# Patient Record
Sex: Male | Born: 1970 | Race: White | Hispanic: No | Marital: Married | Smoking: Never smoker
Health system: Southern US, Community
[De-identification: ages and names within clinical notes are randomized; demographics above are authoritative.]

## PROBLEM LIST (undated history)

## (undated) DIAGNOSIS — K5792 Diverticulitis of intestine, part unspecified, without perforation or abscess without bleeding: Secondary | ICD-10-CM

## (undated) DIAGNOSIS — K429 Umbilical hernia without obstruction or gangrene: Secondary | ICD-10-CM

## (undated) DIAGNOSIS — J45909 Unspecified asthma, uncomplicated: Secondary | ICD-10-CM

## (undated) DIAGNOSIS — I10 Essential (primary) hypertension: Secondary | ICD-10-CM

## (undated) DIAGNOSIS — N400 Enlarged prostate without lower urinary tract symptoms: Secondary | ICD-10-CM

## (undated) DIAGNOSIS — F419 Anxiety disorder, unspecified: Secondary | ICD-10-CM

## (undated) DIAGNOSIS — E785 Hyperlipidemia, unspecified: Secondary | ICD-10-CM

## (undated) DIAGNOSIS — E669 Obesity, unspecified: Secondary | ICD-10-CM

## (undated) DIAGNOSIS — G4730 Sleep apnea, unspecified: Secondary | ICD-10-CM

## (undated) DIAGNOSIS — T7840XA Allergy, unspecified, initial encounter: Secondary | ICD-10-CM

## (undated) DIAGNOSIS — N390 Urinary tract infection, site not specified: Secondary | ICD-10-CM

## (undated) DIAGNOSIS — M1990 Unspecified osteoarthritis, unspecified site: Secondary | ICD-10-CM

## (undated) DIAGNOSIS — K635 Polyp of colon: Secondary | ICD-10-CM

## (undated) DIAGNOSIS — K219 Gastro-esophageal reflux disease without esophagitis: Secondary | ICD-10-CM

## (undated) DIAGNOSIS — E119 Type 2 diabetes mellitus without complications: Secondary | ICD-10-CM

## (undated) DIAGNOSIS — F32A Depression, unspecified: Secondary | ICD-10-CM

## (undated) DIAGNOSIS — M5430 Sciatica, unspecified side: Secondary | ICD-10-CM

## (undated) DIAGNOSIS — K760 Fatty (change of) liver, not elsewhere classified: Secondary | ICD-10-CM

## (undated) DIAGNOSIS — F329 Major depressive disorder, single episode, unspecified: Secondary | ICD-10-CM

## (undated) HISTORY — DX: Type 2 diabetes mellitus without complications: E11.9

## (undated) HISTORY — DX: Umbilical hernia without obstruction or gangrene: K42.9

## (undated) HISTORY — DX: Depression, unspecified: F32.A

## (undated) HISTORY — DX: Hyperlipidemia, unspecified: E78.5

## (undated) HISTORY — DX: Diverticulitis of intestine, part unspecified, without perforation or abscess without bleeding: K57.92

## (undated) HISTORY — DX: Unspecified asthma, uncomplicated: J45.909

## (undated) HISTORY — PX: COLONOSCOPY: SHX174

## (undated) HISTORY — DX: Essential (primary) hypertension: I10

## (undated) HISTORY — DX: Unspecified osteoarthritis, unspecified site: M19.90

## (undated) HISTORY — DX: Gastro-esophageal reflux disease without esophagitis: K21.9

## (undated) HISTORY — PX: OTHER SURGICAL HISTORY: SHX169

## (undated) HISTORY — DX: Obesity, unspecified: E66.9

## (undated) HISTORY — DX: Anxiety disorder, unspecified: F41.9

## (undated) HISTORY — DX: Urinary tract infection, site not specified: N39.0

## (undated) HISTORY — DX: Major depressive disorder, single episode, unspecified: F32.9

## (undated) HISTORY — DX: Polyp of colon: K63.5

## (undated) HISTORY — PX: POLYPECTOMY: SHX149

## (undated) HISTORY — DX: Benign prostatic hyperplasia without lower urinary tract symptoms: N40.0

## (undated) HISTORY — DX: Allergy, unspecified, initial encounter: T78.40XA

## (undated) NOTE — *Deleted (*Deleted)
PROGRESS NOTE  Samuel Lang  DOB: 1971/06/30  PCP: Dois Davenport, MD YNW:[id number]  DOA: 06/23/2020  LOS: 1 day   Chief Complaint  Patient presents with  . Stroke Symptoms   Brief narrative: Patient is a 45 year old male with PMH significant for refractory HTN, HLD, morbid obesity, obstructive sleep apnea on CPAP, gynecomastia on hormone manipulation. Patient presented to the ED on 11/19 with speech disturbance. Last known normal around 11 AM on 11/19.  While at work he noticed sudden onset of inability to comprehend task.   He had significant difficulty with his speech as well as fogginess in his thought. He drove himself to the hospital and his symptoms lasted for around 1 to 2 hours.  Of note, patient reports episodes of falling asleep while driving in the last few months.  In the recent weeks, patient also had multiple episodes of lightheadedness and dizziness.    In the ED, his blood pressure was elevated up to 192/94. He had mild symptoms and was outside window for TPA.  Symptoms lasted for 1 to 2 hours and resolved while in the ED. Labs with potassium low at 3.4, hemoglobin was elevated at 19 CT scan of the brain did not show any acute changes. MRI brain did not show any acute infarction hemorrhage or mass. MRI head and neck did not show any significant stenosis. Patient was admitted to hospitalist service.  Neurology consultation was obtained.  Subjective: Patient was seen and examined this morning. Pleasant middle-aged Caucasian male.  Lying down in bed.  Not in distress. Chart reviewed. Blood pressure elevated to 170/95.  Assessment/Plan: Transient ischemic attack -Presented with sudden onset of confusion and word finding difficulty that lasted about 1 to 2 hours. -Symptoms most likely because of TIA.  Also possibility of hypertensive emergency given significant elevated blood pressure on arrival.   -TIA risk factors include uncontrolled hypertension,  hyperlipidemia, morbid obesity, sleep apnea, use of hormones. -Imagings as above with negative findings -Risk factor modification suggested. -A1c 5.6, LDL 98 -Pending echocardiogram, PT evaluation. -Patient seems to be on Crestor 20 mg daily at home.  Hypertensive urgency History of refractory hypertension -Blood pressure significantly better to 190s on presentation. -Reports compliance to home medicines which include Bystolic 20 mg daily, verapamil 120 mg daily, olmesartan/HCTZ 40 mg / 12.5 mg -Continue same medicines for now. -IV hydralazine as needed.  Prediabetes -A1c 5.6. -On Metformin at home.  I do not think he needs to continue Metformin.  Morbid obesity - Body mass index is 42.58 kg/m. Patient has been advised to make an attempt to improve diet and exercise patterns to aid in weight loss. -Also recommend bariatric evaluation as an outpatient  Obstructive sleep apnea -Encouraged to improve compliance to CPAP.  Gynecomastia -On anastrozole 0.25 mg daily at home. -Hormone supplement tends to increase the risk of stroke.  Ideally would recommend to stop it. -Patient states he used to see endocrinologist and was on testosterone.  It was later switched to anastrozole by urologist.  Patient has not followed up with endocrinologist in a long while.  I recommend him to follow-up with endocrinologist to see if he can come off any hormone replacement therapy.  Polycythemia -Probably secondary to hormone supplement and untreated sleep apnea. -May also increase the risk of TIA by increasing blood viscosity. Recent Labs    06/23/20 1439 06/23/20 1447 06/24/20 0237  HGB 18.2* 19.0* 17.7*   Anxiety/depression -On Wellbutrin   BPH -Continue finasteride  Mobility: Pending PT eval  Code Status:   Code Status: Full Code  Nutritional status: Body mass index is 42.58 kg/m.     Diet Order            Diet heart healthy/carb modified Room service appropriate? Yes; Fluid  consistency: Thin  Diet effective now                 DVT prophylaxis: SCDs   Antimicrobials:  None Fluid: None Consultants: Neurology Family Communication:  Not at bedside  Status is: Inpatient  {Inpatient:23812}  Dispo: The patient is from: {From:23814}              Anticipated d/c is to: {To:23815}              Anticipated d/c date is: {Days:23816}              Patient currently {Medically stable:23817}       Infusions:    Scheduled Meds: . buPROPion  100 mg Oral q AM  . enoxaparin (LOVENOX) injection  70 mg Subcutaneous Q24H  . finasteride  5 mg Oral Daily  . hydrochlorothiazide  12.5 mg Oral Daily  . insulin aspart  0-15 Units Subcutaneous TID WC  . irbesartan  300 mg Oral Daily  . magnesium oxide  400 mg Oral Daily  . metoprolol tartrate  100 mg Oral BID  . pantoprazole  40 mg Oral Daily  . rosuvastatin  20 mg Oral Daily  . verapamil  120 mg Oral q AM    Antimicrobials: Anti-infectives (From admission, onward)   None      PRN meds: acetaminophen **OR** acetaminophen (TYLENOL) oral liquid 160 mg/5 mL **OR** acetaminophen, albuterol, hydrALAZINE, ibuprofen, loratadine, triamcinolone   Objective: Vitals:   06/24/20 0415 06/24/20 0607  BP: (!) 159/75 (!) 170/95  Pulse: (!) 59 64  Resp:    Temp: 97.9 F (36.6 C)   SpO2: 96%    No intake or output data in the 24 hours ending 06/24/20 0749 Filed Weights   06/23/20 1420 06/23/20 2142  Weight: (!) 142.4 kg (!) 142.4 kg   Weight change:  Body mass index is 42.58 kg/m.   Physical Exam: General exam: *** Skin: No rashes, lesions or ulcers. HEENT: Atraumatic, normocephalic, no obvious bleeding Lungs: *** CVS: *** GI/Abd *** CNS: *** Psychiatry: *** Extremities: ***  Data Review: I have personally reviewed the laboratory data and studies available.  Recent Labs  Lab 06/23/20 1439 06/23/20 1447 06/24/20 0237  WBC 9.3  --  9.7  NEUTROABS 6.1  --  5.9  HGB 18.2* 19.0* 17.7*  HCT 54.2*  56.0* 50.8  MCV 91.6  --  90.7  PLT 151  --  153   Recent Labs  Lab 06/23/20 1439 06/23/20 1447  NA 136 138  K 3.5 3.4*  CL 100 98  CO2 25  --   GLUCOSE 219* 209*  BUN 6 7  CREATININE 0.81 0.60*  CALCIUM 9.8  --     F/u labs ***  Signed, Lorin Glass, MD [facility] 06/24/2020

---

## 2004-08-21 ENCOUNTER — Emergency Department (HOSPITAL_COMMUNITY): Admission: EM | Admit: 2004-08-21 | Discharge: 2004-08-22 | Payer: Self-pay | Admitting: Emergency Medicine

## 2004-08-24 ENCOUNTER — Ambulatory Visit: Payer: Self-pay | Admitting: Internal Medicine

## 2004-08-29 ENCOUNTER — Ambulatory Visit: Payer: Self-pay | Admitting: Internal Medicine

## 2004-08-31 ENCOUNTER — Ambulatory Visit: Payer: Self-pay | Admitting: Internal Medicine

## 2004-09-04 ENCOUNTER — Ambulatory Visit: Payer: Self-pay | Admitting: Internal Medicine

## 2004-09-07 ENCOUNTER — Ambulatory Visit: Payer: Self-pay | Admitting: Internal Medicine

## 2004-09-11 ENCOUNTER — Ambulatory Visit: Payer: Self-pay | Admitting: Internal Medicine

## 2004-09-17 ENCOUNTER — Ambulatory Visit: Payer: Self-pay | Admitting: Internal Medicine

## 2004-09-21 ENCOUNTER — Ambulatory Visit: Payer: Self-pay | Admitting: Internal Medicine

## 2004-09-24 ENCOUNTER — Ambulatory Visit: Payer: Self-pay | Admitting: Internal Medicine

## 2004-10-01 ENCOUNTER — Ambulatory Visit: Payer: Self-pay | Admitting: Internal Medicine

## 2004-10-04 ENCOUNTER — Ambulatory Visit: Payer: Self-pay | Admitting: Internal Medicine

## 2004-10-09 ENCOUNTER — Ambulatory Visit: Payer: Self-pay | Admitting: Internal Medicine

## 2004-10-12 ENCOUNTER — Ambulatory Visit: Payer: Self-pay | Admitting: Internal Medicine

## 2004-10-16 ENCOUNTER — Ambulatory Visit: Payer: Self-pay | Admitting: Internal Medicine

## 2004-10-19 ENCOUNTER — Ambulatory Visit: Payer: Self-pay | Admitting: Internal Medicine

## 2004-10-26 ENCOUNTER — Ambulatory Visit: Payer: Self-pay | Admitting: Internal Medicine

## 2004-10-29 ENCOUNTER — Ambulatory Visit: Payer: Self-pay | Admitting: Internal Medicine

## 2004-10-30 ENCOUNTER — Ambulatory Visit: Payer: Self-pay | Admitting: Internal Medicine

## 2004-11-01 ENCOUNTER — Ambulatory Visit: Payer: Self-pay | Admitting: Internal Medicine

## 2004-11-06 ENCOUNTER — Ambulatory Visit: Payer: Self-pay | Admitting: Internal Medicine

## 2004-11-07 ENCOUNTER — Ambulatory Visit: Payer: Self-pay | Admitting: Internal Medicine

## 2004-11-09 ENCOUNTER — Ambulatory Visit: Payer: Self-pay | Admitting: Internal Medicine

## 2004-11-13 ENCOUNTER — Ambulatory Visit: Payer: Self-pay | Admitting: Internal Medicine

## 2004-11-20 ENCOUNTER — Ambulatory Visit: Payer: Self-pay | Admitting: Internal Medicine

## 2004-11-23 ENCOUNTER — Ambulatory Visit: Payer: Self-pay | Admitting: Internal Medicine

## 2004-11-26 ENCOUNTER — Ambulatory Visit: Payer: Self-pay | Admitting: Internal Medicine

## 2004-12-03 ENCOUNTER — Ambulatory Visit: Payer: Self-pay | Admitting: Internal Medicine

## 2004-12-07 ENCOUNTER — Ambulatory Visit: Payer: Self-pay | Admitting: Internal Medicine

## 2004-12-11 ENCOUNTER — Ambulatory Visit: Payer: Self-pay | Admitting: Internal Medicine

## 2004-12-14 ENCOUNTER — Ambulatory Visit: Payer: Self-pay | Admitting: Internal Medicine

## 2004-12-17 ENCOUNTER — Ambulatory Visit: Payer: Self-pay | Admitting: Internal Medicine

## 2004-12-21 ENCOUNTER — Ambulatory Visit: Payer: Self-pay | Admitting: Internal Medicine

## 2004-12-24 ENCOUNTER — Ambulatory Visit: Payer: Self-pay | Admitting: Internal Medicine

## 2004-12-27 ENCOUNTER — Ambulatory Visit: Payer: Self-pay | Admitting: Internal Medicine

## 2005-01-03 ENCOUNTER — Ambulatory Visit: Payer: Self-pay | Admitting: Internal Medicine

## 2005-01-09 ENCOUNTER — Ambulatory Visit: Payer: Self-pay | Admitting: Internal Medicine

## 2005-01-18 ENCOUNTER — Ambulatory Visit: Payer: Self-pay | Admitting: Internal Medicine

## 2005-01-25 ENCOUNTER — Ambulatory Visit: Payer: Self-pay | Admitting: Internal Medicine

## 2005-02-01 ENCOUNTER — Ambulatory Visit: Payer: Self-pay | Admitting: Internal Medicine

## 2005-02-13 ENCOUNTER — Ambulatory Visit: Payer: Self-pay | Admitting: Internal Medicine

## 2005-02-21 ENCOUNTER — Ambulatory Visit: Payer: Self-pay | Admitting: Internal Medicine

## 2005-02-28 ENCOUNTER — Ambulatory Visit: Payer: Self-pay | Admitting: Internal Medicine

## 2005-03-08 ENCOUNTER — Ambulatory Visit: Payer: Self-pay | Admitting: Internal Medicine

## 2005-03-22 ENCOUNTER — Ambulatory Visit: Payer: Self-pay | Admitting: Internal Medicine

## 2005-04-02 ENCOUNTER — Ambulatory Visit: Payer: Self-pay | Admitting: Internal Medicine

## 2005-04-10 ENCOUNTER — Ambulatory Visit: Payer: Self-pay | Admitting: Internal Medicine

## 2005-04-19 ENCOUNTER — Ambulatory Visit: Payer: Self-pay | Admitting: Internal Medicine

## 2005-05-03 ENCOUNTER — Ambulatory Visit: Payer: Self-pay | Admitting: Internal Medicine

## 2005-05-09 ENCOUNTER — Ambulatory Visit: Payer: Self-pay | Admitting: Internal Medicine

## 2005-05-10 ENCOUNTER — Ambulatory Visit: Payer: Self-pay | Admitting: Internal Medicine

## 2005-05-22 ENCOUNTER — Ambulatory Visit: Payer: Self-pay | Admitting: Internal Medicine

## 2005-05-29 ENCOUNTER — Ambulatory Visit: Payer: Self-pay | Admitting: Internal Medicine

## 2005-06-06 ENCOUNTER — Ambulatory Visit: Payer: Self-pay | Admitting: Internal Medicine

## 2005-06-18 ENCOUNTER — Ambulatory Visit: Payer: Self-pay | Admitting: Internal Medicine

## 2005-07-05 ENCOUNTER — Ambulatory Visit: Payer: Self-pay | Admitting: Internal Medicine

## 2005-07-11 ENCOUNTER — Ambulatory Visit: Payer: Self-pay | Admitting: Internal Medicine

## 2005-07-17 ENCOUNTER — Ambulatory Visit: Payer: Self-pay | Admitting: Internal Medicine

## 2005-07-26 ENCOUNTER — Ambulatory Visit: Payer: Self-pay | Admitting: Internal Medicine

## 2005-08-09 ENCOUNTER — Ambulatory Visit: Payer: Self-pay | Admitting: Internal Medicine

## 2005-08-16 ENCOUNTER — Ambulatory Visit: Payer: Self-pay | Admitting: Internal Medicine

## 2005-08-22 ENCOUNTER — Ambulatory Visit: Payer: Self-pay | Admitting: Internal Medicine

## 2005-08-28 ENCOUNTER — Ambulatory Visit: Payer: Self-pay | Admitting: Internal Medicine

## 2005-09-03 ENCOUNTER — Ambulatory Visit: Payer: Self-pay | Admitting: Internal Medicine

## 2005-09-11 ENCOUNTER — Ambulatory Visit: Payer: Self-pay | Admitting: Internal Medicine

## 2005-09-24 ENCOUNTER — Ambulatory Visit: Payer: Self-pay | Admitting: Internal Medicine

## 2005-10-01 ENCOUNTER — Ambulatory Visit: Payer: Self-pay | Admitting: Internal Medicine

## 2005-10-16 ENCOUNTER — Ambulatory Visit: Payer: Self-pay | Admitting: Internal Medicine

## 2005-10-22 ENCOUNTER — Ambulatory Visit: Payer: Self-pay | Admitting: Internal Medicine

## 2005-10-31 ENCOUNTER — Ambulatory Visit: Payer: Self-pay | Admitting: Internal Medicine

## 2005-11-07 ENCOUNTER — Ambulatory Visit: Payer: Self-pay | Admitting: Internal Medicine

## 2005-11-18 ENCOUNTER — Ambulatory Visit: Payer: Self-pay | Admitting: Internal Medicine

## 2005-11-27 ENCOUNTER — Ambulatory Visit: Payer: Self-pay | Admitting: Internal Medicine

## 2005-12-11 ENCOUNTER — Ambulatory Visit: Payer: Self-pay | Admitting: Internal Medicine

## 2005-12-27 ENCOUNTER — Ambulatory Visit: Payer: Self-pay | Admitting: Internal Medicine

## 2006-01-06 ENCOUNTER — Ambulatory Visit: Payer: Self-pay | Admitting: Internal Medicine

## 2006-01-17 ENCOUNTER — Ambulatory Visit: Payer: Self-pay | Admitting: Internal Medicine

## 2006-01-23 ENCOUNTER — Ambulatory Visit: Payer: Self-pay | Admitting: Internal Medicine

## 2006-01-31 ENCOUNTER — Ambulatory Visit: Payer: Self-pay | Admitting: Internal Medicine

## 2006-02-04 ENCOUNTER — Ambulatory Visit: Payer: Self-pay | Admitting: Internal Medicine

## 2006-02-14 ENCOUNTER — Ambulatory Visit: Payer: Self-pay | Admitting: Internal Medicine

## 2006-03-11 ENCOUNTER — Ambulatory Visit: Payer: Self-pay | Admitting: Internal Medicine

## 2006-03-18 ENCOUNTER — Ambulatory Visit: Payer: Self-pay | Admitting: Internal Medicine

## 2006-03-27 ENCOUNTER — Ambulatory Visit: Payer: Self-pay | Admitting: Internal Medicine

## 2006-04-01 ENCOUNTER — Ambulatory Visit: Payer: Self-pay | Admitting: Internal Medicine

## 2006-04-08 ENCOUNTER — Ambulatory Visit: Payer: Self-pay | Admitting: Internal Medicine

## 2006-04-18 ENCOUNTER — Ambulatory Visit: Payer: Self-pay | Admitting: Internal Medicine

## 2006-04-24 ENCOUNTER — Ambulatory Visit: Payer: Self-pay | Admitting: Internal Medicine

## 2006-05-07 ENCOUNTER — Ambulatory Visit: Payer: Self-pay | Admitting: Internal Medicine

## 2006-05-12 ENCOUNTER — Ambulatory Visit: Payer: Self-pay | Admitting: Internal Medicine

## 2006-05-23 ENCOUNTER — Ambulatory Visit: Payer: Self-pay | Admitting: Internal Medicine

## 2006-06-06 ENCOUNTER — Ambulatory Visit: Payer: Self-pay | Admitting: Internal Medicine

## 2006-06-18 ENCOUNTER — Ambulatory Visit: Payer: Self-pay | Admitting: Internal Medicine

## 2006-07-01 ENCOUNTER — Ambulatory Visit: Payer: Self-pay | Admitting: Internal Medicine

## 2006-07-08 ENCOUNTER — Ambulatory Visit: Payer: Self-pay | Admitting: Internal Medicine

## 2006-07-18 ENCOUNTER — Ambulatory Visit: Payer: Self-pay | Admitting: Internal Medicine

## 2006-07-31 ENCOUNTER — Ambulatory Visit: Payer: Self-pay | Admitting: Internal Medicine

## 2006-08-19 ENCOUNTER — Ambulatory Visit: Payer: Self-pay | Admitting: Internal Medicine

## 2006-08-29 ENCOUNTER — Ambulatory Visit: Payer: Self-pay | Admitting: Internal Medicine

## 2006-09-10 ENCOUNTER — Ambulatory Visit: Payer: Self-pay | Admitting: Internal Medicine

## 2006-09-19 ENCOUNTER — Ambulatory Visit: Payer: Self-pay | Admitting: Internal Medicine

## 2006-10-01 ENCOUNTER — Ambulatory Visit: Payer: Self-pay | Admitting: Internal Medicine

## 2006-10-09 ENCOUNTER — Ambulatory Visit: Payer: Self-pay | Admitting: Internal Medicine

## 2006-10-16 ENCOUNTER — Ambulatory Visit: Payer: Self-pay | Admitting: Internal Medicine

## 2006-10-31 ENCOUNTER — Ambulatory Visit: Payer: Self-pay | Admitting: Internal Medicine

## 2006-11-04 ENCOUNTER — Ambulatory Visit: Payer: Self-pay | Admitting: Internal Medicine

## 2006-11-11 ENCOUNTER — Ambulatory Visit: Payer: Self-pay | Admitting: Internal Medicine

## 2006-11-13 ENCOUNTER — Ambulatory Visit: Payer: Self-pay | Admitting: Internal Medicine

## 2006-11-21 ENCOUNTER — Ambulatory Visit: Payer: Self-pay | Admitting: Internal Medicine

## 2006-12-05 ENCOUNTER — Ambulatory Visit: Payer: Self-pay | Admitting: Internal Medicine

## 2006-12-24 ENCOUNTER — Ambulatory Visit: Payer: Self-pay | Admitting: Internal Medicine

## 2007-01-06 ENCOUNTER — Ambulatory Visit: Payer: Self-pay | Admitting: Internal Medicine

## 2007-01-23 ENCOUNTER — Ambulatory Visit: Payer: Self-pay | Admitting: Internal Medicine

## 2007-02-10 ENCOUNTER — Ambulatory Visit: Payer: Self-pay | Admitting: Internal Medicine

## 2007-02-20 ENCOUNTER — Ambulatory Visit: Payer: Self-pay | Admitting: Internal Medicine

## 2007-02-27 ENCOUNTER — Ambulatory Visit: Payer: Self-pay | Admitting: Internal Medicine

## 2007-03-13 ENCOUNTER — Ambulatory Visit: Payer: Self-pay | Admitting: Internal Medicine

## 2007-03-27 ENCOUNTER — Ambulatory Visit: Payer: Self-pay | Admitting: Internal Medicine

## 2007-04-02 ENCOUNTER — Ambulatory Visit: Payer: Self-pay | Admitting: Internal Medicine

## 2007-04-23 ENCOUNTER — Ambulatory Visit: Payer: Self-pay | Admitting: Internal Medicine

## 2007-05-05 ENCOUNTER — Ambulatory Visit: Payer: Self-pay | Admitting: Internal Medicine

## 2007-05-21 ENCOUNTER — Ambulatory Visit: Payer: Self-pay | Admitting: Pulmonary Disease

## 2007-05-21 ENCOUNTER — Ambulatory Visit: Payer: Self-pay | Admitting: Internal Medicine

## 2007-06-03 ENCOUNTER — Ambulatory Visit: Payer: Self-pay | Admitting: Internal Medicine

## 2007-06-19 ENCOUNTER — Ambulatory Visit: Payer: Self-pay | Admitting: Internal Medicine

## 2007-06-26 ENCOUNTER — Ambulatory Visit: Payer: Self-pay | Admitting: Internal Medicine

## 2007-07-01 ENCOUNTER — Ambulatory Visit: Payer: Self-pay | Admitting: Internal Medicine

## 2007-07-10 ENCOUNTER — Ambulatory Visit: Payer: Self-pay | Admitting: Internal Medicine

## 2007-07-17 ENCOUNTER — Ambulatory Visit: Payer: Self-pay | Admitting: Internal Medicine

## 2007-08-04 ENCOUNTER — Ambulatory Visit: Payer: Self-pay | Admitting: Internal Medicine

## 2007-08-14 ENCOUNTER — Ambulatory Visit: Payer: Self-pay | Admitting: Internal Medicine

## 2007-09-03 ENCOUNTER — Ambulatory Visit: Payer: Self-pay | Admitting: Internal Medicine

## 2007-09-11 ENCOUNTER — Ambulatory Visit: Payer: Self-pay | Admitting: Internal Medicine

## 2007-09-17 ENCOUNTER — Ambulatory Visit: Payer: Self-pay | Admitting: Internal Medicine

## 2007-10-02 ENCOUNTER — Ambulatory Visit: Payer: Self-pay | Admitting: Internal Medicine

## 2007-10-23 ENCOUNTER — Ambulatory Visit: Payer: Self-pay | Admitting: Internal Medicine

## 2007-11-03 ENCOUNTER — Ambulatory Visit: Payer: Self-pay | Admitting: Internal Medicine

## 2007-11-20 ENCOUNTER — Ambulatory Visit: Payer: Self-pay | Admitting: Internal Medicine

## 2007-12-11 ENCOUNTER — Ambulatory Visit: Payer: Self-pay | Admitting: Internal Medicine

## 2007-12-25 ENCOUNTER — Ambulatory Visit: Payer: Self-pay | Admitting: Internal Medicine

## 2008-01-01 ENCOUNTER — Ambulatory Visit: Payer: Self-pay | Admitting: Internal Medicine

## 2008-01-22 ENCOUNTER — Ambulatory Visit: Payer: Self-pay | Admitting: Internal Medicine

## 2008-01-29 ENCOUNTER — Ambulatory Visit: Payer: Self-pay | Admitting: Internal Medicine

## 2008-02-12 ENCOUNTER — Ambulatory Visit: Payer: Self-pay | Admitting: Internal Medicine

## 2008-02-26 ENCOUNTER — Ambulatory Visit: Payer: Self-pay | Admitting: Internal Medicine

## 2008-02-29 ENCOUNTER — Ambulatory Visit: Payer: Self-pay | Admitting: Internal Medicine

## 2008-03-11 ENCOUNTER — Ambulatory Visit: Payer: Self-pay | Admitting: Internal Medicine

## 2008-03-25 ENCOUNTER — Ambulatory Visit: Payer: Self-pay | Admitting: Internal Medicine

## 2008-04-01 ENCOUNTER — Ambulatory Visit: Payer: Self-pay | Admitting: Internal Medicine

## 2008-04-01 DIAGNOSIS — J4520 Mild intermittent asthma, uncomplicated: Secondary | ICD-10-CM

## 2008-04-13 ENCOUNTER — Telehealth (INDEPENDENT_AMBULATORY_CARE_PROVIDER_SITE_OTHER): Payer: Self-pay | Admitting: *Deleted

## 2008-04-15 ENCOUNTER — Ambulatory Visit: Payer: Self-pay | Admitting: Internal Medicine

## 2008-04-27 ENCOUNTER — Ambulatory Visit: Payer: Self-pay | Admitting: Internal Medicine

## 2008-05-18 ENCOUNTER — Ambulatory Visit: Payer: Self-pay | Admitting: Internal Medicine

## 2008-06-03 ENCOUNTER — Ambulatory Visit: Payer: Self-pay | Admitting: Internal Medicine

## 2008-06-27 ENCOUNTER — Ambulatory Visit: Payer: Self-pay | Admitting: Internal Medicine

## 2008-07-11 ENCOUNTER — Ambulatory Visit: Payer: Self-pay | Admitting: Internal Medicine

## 2008-08-04 ENCOUNTER — Ambulatory Visit: Payer: Self-pay | Admitting: Internal Medicine

## 2008-08-04 ENCOUNTER — Ambulatory Visit: Payer: Self-pay | Admitting: Pulmonary Disease

## 2008-08-23 ENCOUNTER — Ambulatory Visit: Payer: Self-pay | Admitting: Internal Medicine

## 2008-09-16 ENCOUNTER — Ambulatory Visit: Payer: Self-pay | Admitting: Internal Medicine

## 2008-10-04 ENCOUNTER — Ambulatory Visit: Payer: Self-pay | Admitting: Internal Medicine

## 2008-10-14 ENCOUNTER — Ambulatory Visit: Payer: Self-pay | Admitting: Internal Medicine

## 2008-11-03 ENCOUNTER — Ambulatory Visit: Payer: Self-pay | Admitting: Internal Medicine

## 2008-11-21 ENCOUNTER — Ambulatory Visit: Payer: Self-pay | Admitting: Internal Medicine

## 2008-12-15 ENCOUNTER — Ambulatory Visit: Payer: Self-pay | Admitting: Internal Medicine

## 2009-01-09 ENCOUNTER — Ambulatory Visit: Payer: Self-pay | Admitting: Internal Medicine

## 2009-01-20 ENCOUNTER — Ambulatory Visit: Payer: Self-pay | Admitting: Internal Medicine

## 2009-02-03 ENCOUNTER — Ambulatory Visit: Payer: Self-pay | Admitting: Internal Medicine

## 2009-02-07 ENCOUNTER — Ambulatory Visit: Payer: Self-pay | Admitting: Internal Medicine

## 2009-02-10 ENCOUNTER — Ambulatory Visit: Payer: Self-pay | Admitting: Internal Medicine

## 2009-02-24 ENCOUNTER — Ambulatory Visit: Payer: Self-pay | Admitting: Internal Medicine

## 2009-03-15 ENCOUNTER — Ambulatory Visit: Payer: Self-pay | Admitting: Internal Medicine

## 2009-03-31 ENCOUNTER — Ambulatory Visit: Payer: Self-pay | Admitting: Internal Medicine

## 2009-04-14 ENCOUNTER — Ambulatory Visit: Payer: Self-pay | Admitting: Internal Medicine

## 2009-04-24 ENCOUNTER — Ambulatory Visit: Payer: Self-pay | Admitting: Internal Medicine

## 2009-05-08 ENCOUNTER — Ambulatory Visit: Payer: Self-pay | Admitting: Internal Medicine

## 2009-05-09 ENCOUNTER — Ambulatory Visit: Payer: Self-pay | Admitting: Internal Medicine

## 2009-06-06 ENCOUNTER — Ambulatory Visit: Payer: Self-pay | Admitting: Internal Medicine

## 2009-06-23 ENCOUNTER — Ambulatory Visit: Payer: Self-pay | Admitting: Internal Medicine

## 2009-06-28 ENCOUNTER — Ambulatory Visit: Payer: Self-pay | Admitting: Internal Medicine

## 2009-07-14 ENCOUNTER — Ambulatory Visit: Payer: Self-pay | Admitting: Internal Medicine

## 2009-08-02 ENCOUNTER — Ambulatory Visit: Payer: Self-pay | Admitting: Internal Medicine

## 2009-08-21 ENCOUNTER — Ambulatory Visit: Payer: Self-pay | Admitting: Internal Medicine

## 2009-09-04 ENCOUNTER — Ambulatory Visit: Payer: Self-pay | Admitting: Internal Medicine

## 2009-09-18 ENCOUNTER — Ambulatory Visit: Payer: Self-pay | Admitting: Internal Medicine

## 2009-09-29 ENCOUNTER — Ambulatory Visit: Payer: Self-pay | Admitting: Internal Medicine

## 2009-10-11 ENCOUNTER — Ambulatory Visit: Payer: Self-pay | Admitting: Internal Medicine

## 2009-10-16 ENCOUNTER — Ambulatory Visit: Payer: Self-pay | Admitting: Internal Medicine

## 2009-11-03 ENCOUNTER — Ambulatory Visit: Payer: Self-pay | Admitting: Internal Medicine

## 2009-11-06 ENCOUNTER — Ambulatory Visit: Payer: Self-pay | Admitting: Internal Medicine

## 2009-11-10 ENCOUNTER — Ambulatory Visit: Payer: Self-pay | Admitting: Internal Medicine

## 2009-11-28 ENCOUNTER — Ambulatory Visit: Payer: Self-pay | Admitting: Internal Medicine

## 2009-12-11 ENCOUNTER — Ambulatory Visit: Payer: Self-pay | Admitting: Internal Medicine

## 2009-12-19 ENCOUNTER — Ambulatory Visit: Payer: Self-pay | Admitting: Internal Medicine

## 2009-12-25 ENCOUNTER — Ambulatory Visit: Payer: Self-pay | Admitting: Internal Medicine

## 2010-01-09 ENCOUNTER — Ambulatory Visit: Payer: Self-pay | Admitting: Internal Medicine

## 2010-01-23 ENCOUNTER — Ambulatory Visit: Payer: Self-pay | Admitting: Internal Medicine

## 2010-02-12 ENCOUNTER — Ambulatory Visit: Payer: Self-pay | Admitting: Internal Medicine

## 2010-03-02 ENCOUNTER — Ambulatory Visit: Payer: Self-pay | Admitting: Internal Medicine

## 2010-03-07 ENCOUNTER — Ambulatory Visit: Payer: Self-pay | Admitting: Internal Medicine

## 2010-03-28 ENCOUNTER — Ambulatory Visit: Payer: Self-pay | Admitting: Internal Medicine

## 2010-04-10 ENCOUNTER — Ambulatory Visit: Payer: Self-pay | Admitting: Internal Medicine

## 2010-04-16 ENCOUNTER — Ambulatory Visit: Payer: Self-pay | Admitting: Internal Medicine

## 2010-04-27 ENCOUNTER — Ambulatory Visit: Payer: Self-pay | Admitting: Internal Medicine

## 2010-05-09 ENCOUNTER — Ambulatory Visit: Payer: Self-pay | Admitting: Internal Medicine

## 2010-06-01 ENCOUNTER — Ambulatory Visit: Payer: Self-pay | Admitting: Internal Medicine

## 2010-06-15 ENCOUNTER — Ambulatory Visit: Payer: Self-pay | Admitting: Internal Medicine

## 2010-06-18 ENCOUNTER — Ambulatory Visit: Payer: Self-pay | Admitting: Internal Medicine

## 2010-06-27 ENCOUNTER — Ambulatory Visit: Payer: Self-pay | Admitting: Internal Medicine

## 2010-07-03 ENCOUNTER — Ambulatory Visit: Payer: Self-pay | Admitting: Internal Medicine

## 2010-07-23 ENCOUNTER — Ambulatory Visit: Payer: Self-pay | Admitting: Internal Medicine

## 2010-08-24 ENCOUNTER — Ambulatory Visit: Payer: Self-pay | Admitting: Internal Medicine

## 2010-09-04 NOTE — Miscellaneous (Signed)
Summary: Injection Record / [facility]    Injection Record / [facility]    Imported By: Lennie Odor 04/06/2010 10:37:38  _____________________________________________________________________  External Attachment:    Type:   Image     Comment:   External Document

## 2010-09-04 NOTE — Miscellaneous (Signed)
Summary: Injection Record/[facility]  Injection Record/[facility]   Imported By: Lanelle Bal 12/06/2009 14:16:33  _____________________________________________________________________  External Attachment:    Type:   Image     Comment:   External Document

## 2010-09-04 NOTE — Assessment & Plan Note (Signed)
Summary: 12 months/apc   Primary Fredrik Mogel/Referring Kahron Kauth:  Samuel Lang  CC:  Yearly follow up visit-allergies..  History of Present Illness: History of Present Illness: 04/01/08- 40 year old man returns for follow-up of allergic rhinitis.  Increased itching of the eyes, sneezing, and nasal congestion since July, not sleeping as well.  Using Claritin or Zyrtec.  Stopped nasal Cortisone.  Continues allergy vaccine here at 1:10. allergy vaccine safety was reviewed.  Jun 01, 2009- Allergic rhinitis, minimal asthma Doing very well as he continues vaccine without problems. Had flu vax yesterday. Asked about asthma, he admits a few times when he has wheezed a little,  but not a problem. We discussed risk/benefit of Nasonex use- recently restarted for the season. Based on shape of palate I asked about snoring. He had a sleep study which didn't show apnea, but has gained weight since then. Loud snoring. Discussed Breath right stirps, mouth pieces, chin straps.   06-01-10- Allergic rhinitis, minimal asthma Yearly follow up visit-allergies. Had a bronchitis this summer and saw his primary office which took care of it., Otherwise the allergy shots have worked really well- he calls them  "life changing". Uses either claritin or zyrtec if needed. Occasional ear stuffiness. Nasonex works very well when needed- used a few times each week. A litle cough and wheeze noted today- sometimes first thing in the morning but not routine. He doesn't feel need to do anything- coffee is sufficient in the mornings as a mild bronchodilator.     Preventive Screening-Counseling & Management  Alcohol-Tobacco     Smoking Status: never  Current Medications (verified): 1)  Crestor 10 Mg Tabs (Rosuvastatin Calcium) .... Take 1 By Mouth Once Daily 2)  Zoloft 100 Mg Tabs (Sertraline Hcl) .... Take 1 By Mouth Once Daily 3)  Claritin 10 Mg Tabs (Loratadine) .... Take 1 By Mouth Once Daily As Needed  Allergies 4)  Allergy Vaccine 1:10 Gh 5)  Nasonex 50 Mcg/act Susp (Mometasone Furoate) .Marland Kitchen.. 1-2 Sprays Each Nostril Once Daily 6)  Zyrtec Hives Relief 10 Mg Tabs (Cetirizine Hcl) .... Take 1 By Mouth Once Daily As Needed Allergies 7)  Multivitamins  Tabs (Multiple Vitamin) .... Take 1 By Mouth Once Daily 8)  Fish Oil 1000 Mg Caps (Omega-3 Fatty Acids) .... Take 3 By Mouth Once Daily 9)  Magnesium 200 Mg Tabs (Magnesium) .... Take 1 By Mouth Once Daily  Allergies (verified): No Known Drug Allergies  Past History:  Past Medical History: Last updated: 04/01/2008  ALLERGIC RHINITIS (ICD-477.9)  Past Surgical History: Last updated: 04/01/2008 Traumatic pneumothorax sledding accident, rib fx/ left pneumothorax, chest tube  Family History: Last updated: 01-Jun-2009 Mother- died lung cancer, smoked Father- died DM, CVA  Social History: Last updated: Jun 01, 2009 Patient never smoked.  [employer] for [employer] Married  Risk Factors: Smoking Status: never (06/01/10)  Review of Systems      See HPI       The patient complains of non-productive cough and nasal congestion/difficulty breathing through nose.  The patient denies shortness of breath with activity, shortness of breath at rest, productive cough, chest pain, irregular heartbeats, acid heartburn, indigestion, loss of appetite, weight change, abdominal pain, difficulty swallowing, sore throat, tooth/dental problems, headaches, sneezing, itching, ear ache, rash, change in color of mucus, and fever.    Vital Signs:  Patient profile:   40 year old male Height:      72 inches Weight:      246 pounds  BMI:     33.48 O2 Sat:      95 % on Room air Pulse rate:   68 / minute BP sitting:   110 / 72  (left arm) Cuff size:   large  Vitals Entered By: Reynaldo Minium CMA (May 09, 2010 9:07 AM)  O2 Flow:  Room air CC: Yearly follow up visit-allergies.   Physical Exam  Additional Exam:  General:  A/Ox3; pleasant and cooperative, NAD, obese SKIN: no rash, lesions NODES: no lymphadenopathy HEENT: [place]/AT, EOM- WNL, Conjuctivae- clear, PERRLA, TM-cerumen, nonobstructing cerumen, Nose- clear, Throat- clear and wnl,  Mallampati III NECK: Supple w/ fair ROM, JVD- none, normal carotid impulses w/o bruits Thyroid- normal to palpation CHEST: Clear to P&A HEART: RRR, no m/g/r heard ABDOMEN: Soft and nl;  AVW:UJWJ, nl pulses, no edema  NEURO: Grossly intact to observation      Impression & Recommendations:  Problem # 1:  ALLERGIC RHINITIS (ICD-477.9)  Excellent control and hjis allergy vaccine has been a big help; Minor occasional wheeze and eustachian dysfunction are noted from history. These might require attention in the future, but he is doing well overall. i discussed use of Sudafed if needed.  His updated medication list for this problem includes:    Claritin 10 Mg Tabs (Loratadine) .Marland Kitchen... Take 1 by mouth once daily as needed allergies    Nasonex 50 Mcg/act Susp (Mometasone furoate) .Marland Kitchen... 1-2 sprays each nostril once daily    Zyrtec Hives Relief 10 Mg Tabs (Cetirizine hcl) .Marland Kitchen... Take 1 by mouth once daily as needed allergies  Medications Added to Medication List This Visit: 1)  Zoloft 100 Mg Tabs (Sertraline hcl) .... Take 1 by mouth once daily 2)  Zyrtec Hives Relief 10 Mg Tabs (Cetirizine hcl) .... Take 1 by mouth once daily as needed allergies 3)  Multivitamins Tabs (Multiple vitamin) .... Take 1 by mouth once daily 4)  Fish Oil 1000 Mg Caps (Omega-3 fatty acids) .... Take 3 by mouth once daily 5)  Magnesium 200 Mg Tabs (Magnesium) .... Take 1 by mouth once daily 6)  Ambien 10 Mg Tabs (Zolpidem tartrate) .Marland Kitchen.. 1 for sleep as needed  Other Orders: Est. Patient Level III (19147) Admin 1st Vaccine (82956) Flu Vaccine 81yrs + (21308)  Patient Instructions: 1)  Please schedule a follow-up appointment in 1 year. 2)  Continue allergy vaccine 3)  Call as needed for script refills  etc. 4)  Flu vax    Flu Vaccine Consent Questions     Do you have a history of severe allergic reactions to this vaccine? no    Any prior history of allergic reactions to egg and/or gelatin? no    Do you have a sensitivity to the preservative Thimersol? no    Do you have a past history of Guillan-Barre Syndrome? no    Do you currently have an acute febrile illness? no    Have you ever had a severe reaction to latex? no    Vaccine information given and explained to patient? yes    Are you currently pregnant? no    Lot Number:[id number]   Exp Date:02/02/2011   Site Given  Left Deltoid IMbflu Vernie Murders  May 09, 2010 9:35 AM

## 2010-09-12 ENCOUNTER — Encounter: Payer: Self-pay | Admitting: Internal Medicine

## 2010-09-12 DIAGNOSIS — J301 Allergic rhinitis due to pollen: Secondary | ICD-10-CM

## 2010-10-10 ENCOUNTER — Encounter: Payer: Self-pay | Admitting: Internal Medicine

## 2010-10-10 ENCOUNTER — Ambulatory Visit (INDEPENDENT_AMBULATORY_CARE_PROVIDER_SITE_OTHER): Payer: BC Managed Care – PPO

## 2010-10-10 DIAGNOSIS — J301 Allergic rhinitis due to pollen: Secondary | ICD-10-CM | POA: Insufficient documentation

## 2010-10-16 NOTE — Assessment & Plan Note (Signed)
Summary: ALLERGY/CB   Nurse Visit   Allergies: No Known Drug Allergies  Orders Added: 1)  Allergy Injection (1) [95115] 

## 2010-10-23 ENCOUNTER — Ambulatory Visit (INDEPENDENT_AMBULATORY_CARE_PROVIDER_SITE_OTHER): Payer: BC Managed Care – PPO

## 2010-10-23 DIAGNOSIS — J301 Allergic rhinitis due to pollen: Secondary | ICD-10-CM

## 2010-10-23 NOTE — Miscellaneous (Signed)
Summary: Injection [employer] risk [employer]   Imported By: Sherian Rein 10/15/2010 14:43:23  _____________________________________________________________________  External Attachment:    Type:   Image     Comment:   External Document

## 2010-11-12 ENCOUNTER — Ambulatory Visit (INDEPENDENT_AMBULATORY_CARE_PROVIDER_SITE_OTHER): Payer: BC Managed Care – PPO

## 2010-11-12 DIAGNOSIS — J301 Allergic rhinitis due to pollen: Secondary | ICD-10-CM

## 2010-12-03 ENCOUNTER — Ambulatory Visit (INDEPENDENT_AMBULATORY_CARE_PROVIDER_SITE_OTHER): Payer: BC Managed Care – PPO

## 2010-12-03 DIAGNOSIS — J309 Allergic rhinitis, unspecified: Secondary | ICD-10-CM

## 2010-12-17 ENCOUNTER — Ambulatory Visit (INDEPENDENT_AMBULATORY_CARE_PROVIDER_SITE_OTHER): Payer: BC Managed Care – PPO

## 2010-12-17 DIAGNOSIS — J309 Allergic rhinitis, unspecified: Secondary | ICD-10-CM

## 2010-12-18 NOTE — Assessment & Plan Note (Signed)
[facility]                             PULMONARY OFFICE NOTE   Samuel Lang, Samuel Lang                        MRN:          [id number]  DATE:04/02/2007                            DOB:          [birth year]    PULMONARY ALLERGY FOLLOW-UP:   PROBLEMS:  1. Allergic rhinitis.  2. Eustachian dysfunction.  3. Mild intermittent asthma.  4. Restless legs.   HISTORY:  He says he has the best allergic status in my life.  He  believes allergy vaccine has helped him substantially.  Occasionally one  eye is read; otherwise, he recognizes no particular symptoms.  Minor  postnasal drainage today.  He continues vaccine now at 1:10 with no side  effect or reaction problems and is trying to avoid obvious triggers.  He  stopped Mirapex for restless legs because it overstimulated and he is  managing without anything now.   OBJECTIVE:  Weight 250 pounds.  BP 122/78.  Eyes, nose, throat and chest were all clear.  Heart sounds were normal.  No obvious restlessness or tremor.   IMPRESSION:  1. Allergic rhinitis.  2. Asthma.  3. Mild restless legs syndrome.   PLAN:  1. Continue vaccine.  2. Schedule return 1 year, earlier p.r.n.  3. Sleep hygiene discussed.     Clinton D. Maple Hudson, MD, Tonny Bollman, FACP  Electronically Signed    CDY/MedQ  DD: 04/06/2007  DT: 04/06/2007  Job #: [id number]   cc:   Lovenia Kim, D.O.

## 2010-12-28 ENCOUNTER — Ambulatory Visit (INDEPENDENT_AMBULATORY_CARE_PROVIDER_SITE_OTHER): Payer: BC Managed Care – PPO

## 2010-12-28 DIAGNOSIS — J309 Allergic rhinitis, unspecified: Secondary | ICD-10-CM

## 2011-01-25 ENCOUNTER — Ambulatory Visit (INDEPENDENT_AMBULATORY_CARE_PROVIDER_SITE_OTHER): Payer: BC Managed Care – PPO

## 2011-01-25 DIAGNOSIS — J309 Allergic rhinitis, unspecified: Secondary | ICD-10-CM

## 2011-02-22 ENCOUNTER — Ambulatory Visit (INDEPENDENT_AMBULATORY_CARE_PROVIDER_SITE_OTHER): Payer: BC Managed Care – PPO

## 2011-02-22 DIAGNOSIS — J309 Allergic rhinitis, unspecified: Secondary | ICD-10-CM

## 2011-03-25 ENCOUNTER — Ambulatory Visit (INDEPENDENT_AMBULATORY_CARE_PROVIDER_SITE_OTHER): Payer: BC Managed Care – PPO

## 2011-03-25 DIAGNOSIS — J309 Allergic rhinitis, unspecified: Secondary | ICD-10-CM

## 2011-04-29 ENCOUNTER — Ambulatory Visit (INDEPENDENT_AMBULATORY_CARE_PROVIDER_SITE_OTHER): Payer: BC Managed Care – PPO

## 2011-04-29 DIAGNOSIS — J309 Allergic rhinitis, unspecified: Secondary | ICD-10-CM

## 2011-05-08 ENCOUNTER — Ambulatory Visit: Payer: Self-pay | Admitting: Internal Medicine

## 2012-03-31 ENCOUNTER — Telehealth: Payer: Self-pay | Admitting: *Deleted

## 2012-03-31 NOTE — Telephone Encounter (Signed)
I have called pt.no response. Samuel Lang had an appt.05/08/11;he nos.His last shot was 04/29/11. I assume he stopped his shots. He would go a few wks.,sometimes a month,he's never just stopped coming.Please advise.

## 2012-03-31 NOTE — Telephone Encounter (Signed)
D/C his allergy vaccine based on non-compliance

## 2012-04-01 NOTE — Telephone Encounter (Signed)
Done

## 2012-12-09 ENCOUNTER — Encounter (HOSPITAL_COMMUNITY): Payer: Self-pay | Admitting: *Deleted

## 2012-12-09 ENCOUNTER — Emergency Department (HOSPITAL_COMMUNITY)
Admission: EM | Admit: 2012-12-09 | Discharge: 2012-12-09 | Disposition: A | Discharge status: Home / Self Care | Payer: BC Managed Care – PPO | Attending: Emergency Medicine | Admitting: Emergency Medicine

## 2012-12-09 DIAGNOSIS — K5732 Diverticulitis of large intestine without perforation or abscess without bleeding: Secondary | ICD-10-CM | POA: Insufficient documentation

## 2012-12-09 DIAGNOSIS — Z8709 Personal history of other diseases of the respiratory system: Secondary | ICD-10-CM | POA: Insufficient documentation

## 2012-12-09 DIAGNOSIS — R197 Diarrhea, unspecified: Secondary | ICD-10-CM | POA: Insufficient documentation

## 2012-12-09 DIAGNOSIS — Z79899 Other long term (current) drug therapy: Secondary | ICD-10-CM | POA: Insufficient documentation

## 2012-12-09 DIAGNOSIS — R509 Fever, unspecified: Secondary | ICD-10-CM | POA: Insufficient documentation

## 2012-12-09 DIAGNOSIS — Z9889 Other specified postprocedural states: Secondary | ICD-10-CM | POA: Insufficient documentation

## 2012-12-09 DIAGNOSIS — K5792 Diverticulitis of intestine, part unspecified, without perforation or abscess without bleeding: Secondary | ICD-10-CM

## 2012-12-09 DIAGNOSIS — K5900 Constipation, unspecified: Secondary | ICD-10-CM | POA: Insufficient documentation

## 2012-12-09 DIAGNOSIS — Z7982 Long term (current) use of aspirin: Secondary | ICD-10-CM | POA: Insufficient documentation

## 2012-12-09 LAB — COMPREHENSIVE METABOLIC PANEL
ALT: 58 U/L — ABNORMAL HIGH (ref 0–53)
AST: 22 U/L (ref 0–37)
Albumin: 3.9 g/dL (ref 3.5–5.2)
Alkaline Phosphatase: 82 U/L (ref 39–117)
BUN: 10 mg/dL (ref 6–23)
CO2: 26 mEq/L (ref 19–32)
Calcium: 9.7 mg/dL (ref 8.4–10.5)
Chloride: 100 mEq/L (ref 96–112)
Creatinine, Ser: 0.87 mg/dL (ref 0.50–1.35)
GFR calc Af Amer: >90 mL/min (ref >90)
GFR calc non Af Amer: >90 mL/min (ref >90)
Glucose, Bld: 108 mg/dL — ABNORMAL HIGH (ref 70–99)
Potassium: 4 mEq/L (ref 3.5–5.1)
Sodium: 135 mEq/L (ref 135–145)
Total Bilirubin: 0.6 mg/dL (ref 0.3–1.2)
Total Protein: 7.8 g/dL (ref 6.0–8.3)

## 2012-12-09 LAB — URINE MICROSCOPIC-ADD ON

## 2012-12-09 LAB — URINALYSIS, ROUTINE W REFLEX MICROSCOPIC
Bilirubin Urine: NEGATIVE
Glucose, UA: NEGATIVE mg/dL
Hgb urine dipstick: NEGATIVE
Ketones, ur: NEGATIVE mg/dL
Nitrite: NEGATIVE
Protein, ur: NEGATIVE mg/dL
Specific Gravity, Urine: 1.018 (ref 1.005–1.030)
Urobilinogen, UA: 1 mg/dL (ref 0.0–1.0)
pH: 7 (ref 5.0–8.0)

## 2012-12-09 LAB — CBC WITH DIFFERENTIAL/PLATELET
Basophils Absolute: 0.1 10*3/uL (ref 0.0–0.1)
Basophils Relative: 1 % (ref 0–1)
Eosinophils Absolute: 0.9 10*3/uL — ABNORMAL HIGH (ref 0.0–0.7)
Eosinophils Relative: 7 % — ABNORMAL HIGH (ref 0–5)
HCT: 45 % (ref 39.0–52.0)
Hemoglobin: 15.9 g/dL (ref 13.0–17.0)
Lymphocytes Relative: 18 % (ref 12–46)
Lymphs Abs: 2.2 10*3/uL (ref 0.7–4.0)
MCH: 29.7 pg (ref 26.0–34.0)
MCHC: 35.3 g/dL (ref 30.0–36.0)
MCV: 84.1 fL (ref 78.0–100.0)
Monocytes Absolute: 1.1 10*3/uL — ABNORMAL HIGH (ref 0.1–1.0)
Monocytes Relative: 9 % (ref 3–12)
Neutro Abs: 7.9 10*3/uL — ABNORMAL HIGH (ref 1.7–7.7)
Neutrophils Relative %: 65 % (ref 43–77)
Platelets: 229 10*3/uL (ref 150–400)
RBC: 5.35 MIL/uL (ref 4.22–5.81)
RDW: 12.4 % (ref 11.5–15.5)
WBC: 12.2 10*3/uL — ABNORMAL HIGH (ref 4.0–10.5)

## 2012-12-09 LAB — LIPASE, BLOOD: Lipase: 29 U/L (ref 11–59)

## 2012-12-09 MED ORDER — CIPROFLOXACIN HCL 500 MG PO TABS: 500.0000 mg | ORAL_TABLET | Freq: Two times a day (BID) | ORAL | Status: DC

## 2012-12-09 MED ORDER — MORPHINE SULFATE 4 MG/ML IJ SOLN
2.0000 mg | Freq: Once | INTRAMUSCULAR | Status: AC
  Administered 2012-12-09: 2 mg via INTRAVENOUS
  Filled 2012-12-09: qty 1

## 2012-12-09 MED ORDER — LORATADINE 10 MG PO TABS
10.0000 mg | ORAL_TABLET | Freq: Every day | ORAL | Status: DC
  Administered 2012-12-09: 10 mg via ORAL
  Filled 2012-12-09: qty 1

## 2012-12-09 MED ORDER — METRONIDAZOLE 500 MG PO TABS: 500.0000 mg | ORAL_TABLET | Freq: Two times a day (BID) | ORAL | Status: DC

## 2012-12-09 MED ORDER — SODIUM CHLORIDE 0.9 % IV SOLN
Freq: Once | INTRAVENOUS | Status: AC
  Administered 2012-12-09: 20:00:00 via INTRAVENOUS

## 2012-12-09 MED ORDER — SODIUM CHLORIDE 0.9 % IV BOLUS (SEPSIS)
500.0000 mL | Freq: Once | INTRAVENOUS | Status: AC
  Administered 2012-12-09: 500 mL via INTRAVENOUS

## 2012-12-09 MED ORDER — METRONIDAZOLE IN NACL 5-0.79 MG/ML-% IV SOLN
500.0000 mg | Freq: Once | INTRAVENOUS | Status: AC
  Administered 2012-12-09: 500 mg via INTRAVENOUS
  Filled 2012-12-09: qty 100

## 2012-12-09 MED ORDER — CIPROFLOXACIN IN D5W 400 MG/200ML IV SOLN
400.0000 mg | Freq: Once | INTRAVENOUS | Status: AC
  Administered 2012-12-09: 400 mg via INTRAVENOUS
  Filled 2012-12-09: qty 200

## 2012-12-09 MED ORDER — HYDROCODONE-ACETAMINOPHEN 5-325 MG PO TABS: 1.0000 | ORAL_TABLET | ORAL | Status: DC | PRN

## 2012-12-09 NOTE — ED Notes (Signed)
MD at bedside. Dr. Kohut at bedside speaking with pt. 

## 2012-12-09 NOTE — ED Notes (Signed)
Bed:WA10<BR> Expected date:<BR> Expected time:<BR> Means of arrival:<BR> Comments:<BR>

## 2012-12-09 NOTE — ED Notes (Signed)
Pt c/o left lower abd pain x's 1 week. Reports hx of diverticulitis. Has been having flares off and on for past couple of years reports pain has increased recently.

## 2012-12-09 NOTE — ED Notes (Signed)
Pt states his allergies are bothering him. Pt c/o stuffy nose. Asking for Claritin. PA aware. Further orders received.

## 2012-12-09 NOTE — ED Provider Notes (Signed)
History     CSN: [id number]  Arrival date & time 12/09/12  1648   First MD Initiated Contact with Patient 12/09/12 1819      Chief Complaint  Patient presents with  . Abdominal Pain    (Consider location/radiation/quality/duration/timing/severity/associated sxs/prior treatment) HPI  This is a 42 year old male past medical history significant for diverticulosis presenting to the ED with left lower quadrant pain 4/10 that began one week ago w/ waxing and waning cramping pain. States he typically has a small flare up with his diverticulosis once a year, but it is managed as outpatient w/ bowel rest, states this was not working this time, but pain and other symptoms similar to previous episodes. Had a colonoscopy done two years ago confirming diverticulosis w/o spread from his original diagnosis 10 years ago. Associated low grade fever, on and off constipation and diarrhea w/o blood. Denies SOB, CP, urinary symptoms.   Past Medical History  Diagnosis Date  . Allergic rhinitis     Past Surgical History  Procedure Laterality Date  . Traumatic pneumothorax sledding accident,rib rx, left pneumothorax, chest tube      History reviewed. No pertinent family history.  History  Substance Use Topics  . Smoking status: Never Smoker   . Smokeless tobacco: Not on file  . Alcohol Use: Yes     Comment: occ      Review of Systems  Constitutional: Positive for fever and chills.  Eyes: Negative.   Respiratory: Negative.   Cardiovascular: Negative.   Gastrointestinal: Positive for abdominal pain, diarrhea and constipation. Negative for nausea, vomiting, blood in stool, abdominal distention, anal bleeding and rectal pain.  Genitourinary: Negative for dysuria.  Musculoskeletal: Negative.   Skin: Negative.   Neurological: Negative for headaches.    Allergies  Review of patient's allergies indicates no known allergies.  Home Medications   Current Outpatient Rx  Name  Route  Sig   Dispense  Refill  . aspirin 325 MG tablet   Oral   Take 325 mg by mouth every 4 (four) hours as needed for pain.         Marland Kitchen loratadine-pseudoephedrine (CLARITIN-D 12-HOUR) 5-120 MG per tablet   Oral   Take 1 tablet by mouth 2 (two) times daily.         . Multiple Vitamins-Minerals (MULTIVITAMIN & MINERAL PO)   Oral   Take 1 tablet by mouth daily.           . sertraline (ZOLOFT) 100 MG tablet   Oral   Take 100 mg by mouth daily.           . ciprofloxacin (CIPRO) 500 MG tablet   Oral   Take 1 tablet (500 mg total) by mouth 2 (two) times daily.   28 tablet   0   . HYDROcodone-acetaminophen (NORCO/VICODIN) 5-325 MG per tablet   Oral   Take 1-2 tablets by mouth every 4 (four) hours as needed for pain.   12 tablet   0   . metroNIDAZOLE (FLAGYL) 500 MG tablet   Oral   Take 1 tablet (500 mg total) by mouth 2 (two) times daily.   14 tablet   0     BP 141/80  Pulse 91  Temp(Src) 98.9 F (37.2 C) (Oral)  Resp 16  SpO2 96%  Physical Exam  Constitutional: He is oriented to person, place, and time. He appears well-developed and well-nourished.  HENT:  Head: Normocephalic and atraumatic.  Eyes: EOM are normal. Pupils are equal, round,  and reactive to light.  Cardiovascular: Normal rate, regular rhythm and normal heart sounds.   Pulmonary/Chest: Effort normal and breath sounds normal.  Abdominal: Soft. Bowel sounds are normal. There is tenderness in the left lower quadrant. There is no rigidity, no rebound, no guarding and no CVA tenderness.    Neurological: He is alert and oriented to person, place, and time.  Skin: Skin is warm and dry.  Psychiatric: He has a normal mood and affect.    ED Course  Procedures (including critical care time)  Medications  loratadine (CLARITIN) tablet 10 mg (10 mg Oral Given 12/09/12 2112)  sodium chloride 0.9 % bolus 500 mL (0 mLs Intravenous Stopped 12/09/12 2209)  0.9 %  sodium chloride infusion ( Intravenous Stopped 12/09/12 2255)    morphine 4 MG/ML injection 2 mg (2 mg Intravenous Given 12/09/12 1930)  metroNIDAZOLE (FLAGYL) IVPB 500 mg (0 mg Intravenous Stopped 12/09/12 2209)  ciprofloxacin (CIPRO) IVPB 400 mg (0 mg Intravenous Stopped 12/09/12 2255)   Abdominal pain improved w/ treatment. Exam remains soft, mildly tender in LLQ, non-distended no peritoneal signs.  Labs Reviewed  CBC WITH DIFFERENTIAL - Abnormal; Notable for the following:    WBC 12.2 (*)    Neutro Abs 7.9 (*)    Monocytes Absolute 1.1 (*)    Eosinophils Relative 7 (*)    Eosinophils Absolute 0.9 (*)    All other components within normal limits  COMPREHENSIVE METABOLIC PANEL - Abnormal; Notable for the following:    Glucose, Bld 108 (*)    ALT 58 (*)    All other components within normal limits  URINALYSIS, ROUTINE W REFLEX MICROSCOPIC - Abnormal; Notable for the following:    Leukocytes, UA TRACE (*)    All other components within normal limits  LIPASE, BLOOD  URINE MICROSCOPIC-ADD ON   No results found.   1. Diverticulitis       MDM  Patient is nontoxic, nonseptic appearing, in no apparent distress.  Patient's pain and other symptoms adequately managed in emergency department.  Fluid bolus given.  Labs and vitals reviewed.  Patient does not meet the SIRS or Sepsis criteria.  On repeat exam patient does not have a surgical abdomin and there are nor peritoneal signs. Pain consistent w/ previous diverticulitis flare ups.  Patient discharged home with outpatient treatment including antibiotics, pain management, and bowel rest and given strict instructions for follow-up with their primary care physician.  I have also discussed reasons to return immediately to the ER.  Patient expresses understanding and agrees with plan. Patient d/w with Dr. Juleen China, agrees with plan. Patient is stable at time of discharge.             Lise Auer Quinlin Conant, PA-C 12/10/12 0040

## 2012-12-15 NOTE — ED Provider Notes (Signed)
Medical screening examination/treatment/procedure(s) were conducted as a shared visit with non-physician practitioner(s) and myself.  I personally evaluated the patient during the encounter.  Pt with LLQ pain. Suspect diverticulitis. Discussed presumptive tx vs CT. Pt opting not to CT at this time which I think is reasonable. He does have some L sided abdominal tenderness on exam, but not peritoneal. Continues cipro/flagyl. PRN pain meds. Stirct return precautions discussed.   Raeford Razor, MD 12/15/12 [phone]

## 2013-04-22 ENCOUNTER — Other Ambulatory Visit: Payer: Self-pay | Admitting: Endocrinology

## 2013-04-22 DIAGNOSIS — E236 Other disorders of pituitary gland: Secondary | ICD-10-CM

## 2013-04-29 ENCOUNTER — Ambulatory Visit
Admission: RE | Admit: 2013-04-29 | Discharge: 2013-04-29 | Disposition: A | Discharge status: Home / Self Care | Payer: BC Managed Care – PPO | Source: Ambulatory Visit | Attending: Endocrinology | Admitting: Endocrinology

## 2013-04-29 DIAGNOSIS — E236 Other disorders of pituitary gland: Secondary | ICD-10-CM

## 2013-04-29 MED ORDER — GADOBENATE DIMEGLUMINE 529 MG/ML IV SOLN
10.0000 mL | Freq: Once | INTRAVENOUS | Status: AC | PRN
  Administered 2013-04-29: 10 mL via INTRAVENOUS

## 2013-08-26 ENCOUNTER — Encounter: Payer: Self-pay | Admitting: Internal Medicine

## 2013-10-25 ENCOUNTER — Ambulatory Visit (INDEPENDENT_AMBULATORY_CARE_PROVIDER_SITE_OTHER): Payer: BC Managed Care – PPO | Admitting: Internal Medicine

## 2013-10-25 ENCOUNTER — Encounter: Payer: Self-pay | Admitting: Internal Medicine

## 2013-10-25 VITALS — BP 122/80 | HR 83 | Resp 14 | Ht 73.0 in | Wt 291.8 lb

## 2013-10-25 DIAGNOSIS — J4520 Mild intermittent asthma, uncomplicated: Secondary | ICD-10-CM

## 2013-10-25 DIAGNOSIS — J45909 Unspecified asthma, uncomplicated: Secondary | ICD-10-CM

## 2013-10-25 DIAGNOSIS — J301 Allergic rhinitis due to pollen: Secondary | ICD-10-CM

## 2013-10-25 NOTE — Patient Instructions (Signed)
Consider otc antihistamine- Allegra/ fexofenadine, Zyrtec/ cetirizine, Claritin/ loratadine  Daily or as needed  You can also try an otc decongestant Sudafed-PE, by itself, or with an antihistamine  You can also use a nasal steroid spray like Flonase/ fluticasone or Nasacort/ triamcinolone.  These may work best used daily during allergy season- 1 or 2 puffs each nostril at bedtime.

## 2013-10-25 NOTE — Progress Notes (Signed)
10/25/13- 49 yoM never smoker -Former patient-had moved and now back again. Has not been on allergy injections since he moved away. Was on allergy vaccine for allergic rhinitis. Last visit here 04/02/2007. Was on allergy vaccine 1:10 at bedtime and doing well, for diagnoses of allergic rhinitis, eustachian dysfunction and mild intermittent asthma. Allergy vaccine was stopped while he was living in [place]. Now complains of itching ears, nasal congestion. Spring worse than fall. Taking Claritin-D and Flonase. He was also followed here in the past for restless leg syndrome and tried Mirapex which he did not like. A sleep study was negative for sleep apnea at that time. He has been wearing an oral appliance for snoring, from a dentist "snoreGuard". 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  Prior to Admission medications   Medication Sig Start Date End Date Taking? Authorizing Provider  ANDROGEL PUMP 20.25 MG/ACT (1.62%) GEL 3 pumps daily 10/06/13  Yes Historical Provider, MD  cholecalciferol (VITAMIN D) 1000 UNITS tablet Take 1,000 Units by mouth daily.   Yes Historical Provider, MD  CRESTOR 5 MG tablet Take 1 tablet by mouth daily. 10/06/13  Yes Historical Provider, MD  loratadine-pseudoephedrine (CLARITIN-D 12-HOUR) 5-120 MG per tablet Take 1 tablet by mouth 2 (two) times daily.   Yes Historical Provider, MD  magnesium gluconate (MAGONATE) 500 MG tablet Take 500 mg by mouth daily.   Yes Historical Provider, MD  Multiple Vitamins-Minerals (MULTIVITAMIN & MINERAL PO) Take 1 tablet by mouth daily.     Yes Historical Provider, MD  Omega-3 Fatty Acids (FISH OIL) 1000 MG CAPS Take 1 capsule by mouth daily.   Yes Historical Provider, MD  sertraline (ZOLOFT) 100 MG tablet Take 100 mg by mouth daily.     Yes Historical Provider, MD  zolpidem (AMBIEN CR) 12.5 MG CR tablet Take 1 tablet by mouth at bedtime as needed. 10/14/13  Yes  Historical Provider, MD   Past Medical History  Diagnosis Date  . Allergic rhinitis   . Hyperlipidemia   . Depression    Past Surgical History  Procedure Laterality Date  . Traumatic pneumothorax sledding accident,rib rx, left pneumothorax, chest tube     Family History  Problem Relation Age of Onset  . Lung cancer Mother   . Allergies Brother   . Stroke Father   . Diabetes Father    History   Social History  . Marital Status: Married    Spouse Name: N/A    Number of Children: 2  . Years of Education: N/A   Occupational History  . College Admin.   [employer]   Social History Main Topics  . Smoking status: Never Smoker   . Smokeless tobacco: Not on file  . Alcohol Use: Yes     Comment: occ  . Drug Use: No  . Sexual Activity: Not on file   Other Topics Concern  . Not on file   Social History Narrative  . No narrative on file   ROS-see HPI Constitutional:   No-   weight loss, night sweats, fevers, chills, fatigue, lassitude. HEENT:   No-  headaches, difficulty swallowing, tooth/dental problems, sore throat,       +sneezing, itching, ear ache, +nasal congestion, post nasal drip,  CV:  No-   chest pain, orthopnea, PND, swelling in lower extremities, anasarca,  dizziness, palpitations Resp: No-   shortness of breath with exertion or at rest.              No-   productive cough,  No non-productive cough,  No- coughing up of blood.              No-   change in color of mucus.  No- wheezing.   Skin: No-   rash or lesions. GI:  + heartburn, indigestion, no-abdominal pain, nausea, vomiting, diarrhea,                 change in bowel habits, loss of appetite GU: No-   dysuria, change in color of urine, no urgency or frequency.  No- flank pain. MS:  No-   joint pain or swelling.  No- decreased range of motion.  No- back pain. Neuro-     nothing unusual Psych:  No- change in mood or affect. +depression or anxiety.  No memory loss.  OBJ-  Physical Exam General- Alert, Oriented, Affect-appropriate, Distress- none acute, overweight Skin- rash-none, lesions- none, excoriation- none Lymphadenopathy- none Head- atraumatic            Eyes- Gross vision intact, PERRLA, conjunctivae and secretions clear            Ears- nonobstructive cerumen            Nose- Clear, no-Septal dev, mucus, polyps, erosion, perforation             Throat- Mallampati III-IV , mucosa clear , drainage- none, tonsils- atrophic Neck- flexible , trachea midline, no stridor , thyroid nl, carotid no bruit Chest - symmetrical excursion , unlabored           Heart/CV- RRR , no murmur , no gallop  , no rub, nl s1 s2                           - JVD- none , edema- none, stasis changes- none, varices- none           Lung- clear to P&A, wheeze- none, cough- none , dullness-none, rub- none           Chest wall-  Abd- tender-no, distended-no, bowel sounds-present, HSM- no Br/ Gen/ Rectal- Not done, not indicated Extrem- cyanosis- none, clubbing, none, atrophy- none, strength- nl Neuro- grossly intact to observation

## 2013-11-14 ENCOUNTER — Encounter: Payer: Self-pay | Admitting: Internal Medicine

## 2013-11-14 NOTE — Assessment & Plan Note (Signed)
Clear now. We will watch this problem

## 2013-11-14 NOTE — Assessment & Plan Note (Signed)
Mild seasonal and perennial allergic rhinitis Plan-try Allegra and Sudafed- PE as we watch onset of spring pollen season.

## 2013-12-28 ENCOUNTER — Encounter (INDEPENDENT_AMBULATORY_CARE_PROVIDER_SITE_OTHER): Payer: Self-pay

## 2013-12-28 ENCOUNTER — Encounter: Payer: Self-pay | Admitting: Internal Medicine

## 2013-12-28 ENCOUNTER — Ambulatory Visit (INDEPENDENT_AMBULATORY_CARE_PROVIDER_SITE_OTHER): Payer: BC Managed Care – PPO | Admitting: Internal Medicine

## 2013-12-28 VITALS — BP 132/74 | HR 66 | Ht 73.0 in | Wt 289.6 lb

## 2013-12-28 DIAGNOSIS — J4520 Mild intermittent asthma, uncomplicated: Secondary | ICD-10-CM

## 2013-12-28 DIAGNOSIS — L255 Unspecified contact dermatitis due to plants, except food: Secondary | ICD-10-CM

## 2013-12-28 DIAGNOSIS — L237 Allergic contact dermatitis due to plants, except food: Secondary | ICD-10-CM

## 2013-12-28 DIAGNOSIS — J45909 Unspecified asthma, uncomplicated: Secondary | ICD-10-CM

## 2013-12-28 DIAGNOSIS — J301 Allergic rhinitis due to pollen: Secondary | ICD-10-CM

## 2013-12-28 MED ORDER — METHYLPREDNISOLONE ACETATE 80 MG/ML IJ SUSP
80.0000 mg | Freq: Once | INTRAMUSCULAR | Status: AC
Start: 2013-12-28 — End: 2013-12-28
  Administered 2013-12-28: 80 mg via INTRAMUSCULAR

## 2013-12-28 NOTE — Patient Instructions (Signed)
Depo 80   For dx acute poison ivy dermatitis  We will schedule allergy skin tests. Please remember that antihistamines directly  Block the skin tests, so it will be important to stay off all antihistamines for 3 days before the skin tests. If you are unsure, please call and ask for Joellen Jersey, my nurse.

## 2013-12-28 NOTE — Progress Notes (Signed)
10/25/13- 89 yoM never smoker -Former patient-had moved and now back again. Has not been on allergy injections since he moved away. Was on allergy vaccine for allergic rhinitis. Last visit here 04/02/2007. Was on allergy vaccine 1:10 at bedtime and doing well, for diagnoses of allergic rhinitis, eustachian dysfunction and mild intermittent asthma. Allergy vaccine was stopped while he was living in [place]. Now complains of itching ears, nasal congestion. Spring worse than fall. Taking Claritin-D and Flonase. He was also followed here in the past for restless leg syndrome and tried Mirapex which he did not like. A sleep study was negative for sleep apnea at that time. He has been wearing an oral appliance for snoring, from a dentist "snoreGuard". 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  [id number] yoM never smoker followed for allergic rhinitis, asthma FOLLOWS FOR: Having out break of posion oak/ivy. Would like something for this Sudafed PE and Allegra have been adequate but not ideal for rhinitis. Nasal symptoms worse in the fall than in the spring with some perennial congestion and drainage. He would like to go back on allergy shots. Incidental acute problem-poison ivy on ankles  ROS-see HPI Constitutional:   No-   weight loss, night sweats, fevers, chills, fatigue, lassitude. HEENT:   No-  headaches, difficulty swallowing, tooth/dental problems, sore throat,       +sneezing, +itching, ear ache, +nasal congestion, post nasal drip,  CV:  No-   chest pain, orthopnea, PND, swelling in lower extremities, anasarca,                                  dizziness, palpitations Resp: No-   shortness of breath with exertion or at rest.              No-   productive cough,  No non-productive cough,  No- coughing up of blood.              No-   change in color of mucus.  No- wheezing.   Skin: +itching rash on ankles GI:  +  heartburn, indigestion, no-abdominal pain, nausea, vomiting GU: No-   dysuria, change in color of urine, no urgency or frequency.  No- flank pain. MS:  No-   joint pain or swelling.   Neuro-     nothing unusual Psych:  No- change in mood or affect. +depression or anxiety.  No memory loss.  OBJ- Physical Exam General- Alert, Oriented, Affect-appropriate, Distress- none acute, overweight Skin- + excoriated rash on ankles consistent with poison ivy contact dermatitis Lymphadenopathy- none Head- atraumatic            Eyes- Gross vision intact, PERRLA, conjunctivae and secretions clear            Ears- nonobstructive cerumen            Nose- Clear, no-Septal dev, mucus, polyps, erosion, perforation             Throat- Mallampati III-IV , mucosa clear , drainage- none, tonsils- atrophic Neck- flexible , trachea midline, no stridor , thyroid nl, carotid no bruit Chest - symmetrical excursion , unlabored           Heart/CV- RRR , no murmur , no gallop  , no rub, nl s1 s2                           -  JVD- none , edema- none, stasis changes- none, varices- none           Lung- clear to P&A, wheeze- none, cough- none , dullness-none, rub- none           Chest wall-  Abd-  Br/ Gen/ Rectal- Not done, not indicated Extrem- cyanosis- none, clubbing, none, atrophy- none, strength- nl Neuro- grossly intact to observation

## 2014-01-05 ENCOUNTER — Ambulatory Visit (INDEPENDENT_AMBULATORY_CARE_PROVIDER_SITE_OTHER): Payer: BC Managed Care – PPO | Admitting: Internal Medicine

## 2014-01-05 ENCOUNTER — Encounter: Payer: Self-pay | Admitting: Internal Medicine

## 2014-01-05 VITALS — BP 136/84 | HR 106 | Ht 73.0 in | Wt 287.4 lb

## 2014-01-05 DIAGNOSIS — J4520 Mild intermittent asthma, uncomplicated: Secondary | ICD-10-CM

## 2014-01-05 DIAGNOSIS — E291 Testicular hypofunction: Secondary | ICD-10-CM

## 2014-01-05 DIAGNOSIS — J45909 Unspecified asthma, uncomplicated: Secondary | ICD-10-CM

## 2014-01-05 DIAGNOSIS — J301 Allergic rhinitis due to pollen: Secondary | ICD-10-CM

## 2014-01-05 DIAGNOSIS — R7989 Other specified abnormal findings of blood chemistry: Secondary | ICD-10-CM

## 2014-01-05 NOTE — Patient Instructions (Addendum)
Order- [facility] - pt would like referral to establish primary care     Dx asthma, low-T  We will be able to start allergy vaccine as discussed. The allergy lab will call you when they are ready to start.  Please call as needed  It will be ok to resume antihistamines as needed

## 2014-01-05 NOTE — Assessment & Plan Note (Signed)
He knows he wants allergy shots. Educated again risk incl anaphylaxis/ death, alterntives, realistic goals. Plan start allergy vaccine

## 2014-01-05 NOTE — Assessment & Plan Note (Signed)
Clear on exam

## 2014-01-05 NOTE — Progress Notes (Signed)
10/25/13- 33 yoM never smoker -Former patient-had moved and now back again. Has not been on allergy injections since he moved away. Was on allergy vaccine for allergic rhinitis. Last visit here 04/02/2007. Was on allergy vaccine 1:10 at that time and doing well, for diagnoses of allergic rhinitis, eustachian dysfunction and mild intermittent asthma. Allergy vaccine was stopped while he was living in [place]. Now complains of itching ears, nasal congestion. Spring worse than fall. Taking Claritin-D and Flonase. He was also followed here in the past for restless leg syndrome and tried Mirapex which he did not like. A sleep study was negative for sleep apnea at that time. He has been wearing an oral appliance for snoring, from a dentist "snoreGuard". 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  [birth year] yoM never smoker followed for allergic rhinitis, asthma FOLLOWS FOR: Having out break of posion oak/ivy. Would like something for this  [birth year]42 yoM never smoker followed for allergic rhinitis, asthma No antihistamines, ORC cough syrups, or OTC sleep aids in past 3 days For allergy skin testing 01/05/14- broadly positive incl grass, tree, weed, dust, animals Wants vaccine. Educated risk/benefit, alternatives. Questions answered.  Now ears itch. Steroid inj last visit cleared poison ivy.   ROS-see HPI Constitutional:   No-   weight loss, night sweats, fevers, chills, fatigue, lassitude. HEENT:   No-  headaches, difficulty swallowing, tooth/dental problems, sore throat,       +sneezing, +itching, ear ache, +nasal congestion, post nasal drip,  CV:  No-   chest pain, orthopnea, PND, swelling in lower extremities, anasarca,                                  dizziness, palpitations Resp: No-   shortness of breath with exertion or at rest.              No-   productive cough,  No non-productive cough,  No- coughing up of blood.               No-   change in color of mucus.  No- wheezing.   Skin: No-   rash or lesions. GI:  + heartburn, indigestion, no-abdominal pain, nausea, vomiting,  GU:  MS:  No-   joint pain or swelling.   Neuro-     nothing unusual Psych:  No- change in mood or affect. +depression or anxiety.  No memory loss.  OBJ- Physical Exam General- Alert, Oriented, Affect-appropriate, Distress- none acute, overweight Skin- rash-none, lesions- none, excoriation- none Lymphadenopathy- none Head- atraumatic            Eyes- Gross vision intact, PERRLA, conjunctivae and secretions clear            Ears- nonobstructive cerumen            Nose- Clear, no-Septal dev, mucus, polyps, erosion, perforation             Throat- Mallampati III-IV , mucosa clear , drainage- none, tonsils- atrophic Neck- flexible , trachea midline, no stridor , thyroid nl, carotid no bruit Chest - symmetrical excursion , unlabored           Heart/CV- RRR , no murmur , no gallop  , no rub, nl s1 s2                           -  JVD- none , edema- none, stasis changes- none, varices- none           Lung- clear to P&A, wheeze- none, cough- none , dullness-none, rub- none           Chest wall-  Abd-  Br/ Gen/ Rectal- Not done, not indicated Extrem- cyanosis- none, clubbing, none, atrophy- none, strength- nl Neuro- grossly intact to observation

## 2014-01-11 ENCOUNTER — Ambulatory Visit (INDEPENDENT_AMBULATORY_CARE_PROVIDER_SITE_OTHER): Payer: BC Managed Care – PPO

## 2014-01-11 DIAGNOSIS — J309 Allergic rhinitis, unspecified: Secondary | ICD-10-CM

## 2014-02-07 ENCOUNTER — Encounter: Payer: Self-pay | Admitting: Internal Medicine

## 2014-02-08 ENCOUNTER — Telehealth: Payer: Self-pay | Admitting: Internal Medicine

## 2014-02-08 ENCOUNTER — Ambulatory Visit (INDEPENDENT_AMBULATORY_CARE_PROVIDER_SITE_OTHER): Payer: BC Managed Care – PPO

## 2014-02-08 DIAGNOSIS — J309 Allergic rhinitis, unspecified: Secondary | ICD-10-CM

## 2014-02-08 NOTE — Telephone Encounter (Signed)
Opened in error

## 2014-02-11 ENCOUNTER — Ambulatory Visit: Payer: BC Managed Care – PPO

## 2014-02-13 DIAGNOSIS — L237 Allergic contact dermatitis due to plants, except food: Secondary | ICD-10-CM | POA: Insufficient documentation

## 2014-02-13 NOTE — Assessment & Plan Note (Signed)
Persistent symptoms. We have discussed allergy skin testing

## 2014-02-13 NOTE — Assessment & Plan Note (Signed)
Typical rash Plan-Depo-Medrol, discussed topical management

## 2014-02-13 NOTE — Assessment & Plan Note (Signed)
Adequately controlled 

## 2014-02-15 ENCOUNTER — Ambulatory Visit (INDEPENDENT_AMBULATORY_CARE_PROVIDER_SITE_OTHER): Payer: BC Managed Care – PPO

## 2014-02-15 DIAGNOSIS — J309 Allergic rhinitis, unspecified: Secondary | ICD-10-CM

## 2014-02-18 ENCOUNTER — Ambulatory Visit: Payer: BC Managed Care – PPO

## 2014-02-22 ENCOUNTER — Ambulatory Visit (INDEPENDENT_AMBULATORY_CARE_PROVIDER_SITE_OTHER): Payer: BC Managed Care – PPO

## 2014-02-22 DIAGNOSIS — J309 Allergic rhinitis, unspecified: Secondary | ICD-10-CM

## 2014-02-25 ENCOUNTER — Ambulatory Visit (INDEPENDENT_AMBULATORY_CARE_PROVIDER_SITE_OTHER): Payer: BC Managed Care – PPO

## 2014-02-25 DIAGNOSIS — J309 Allergic rhinitis, unspecified: Secondary | ICD-10-CM

## 2014-02-28 ENCOUNTER — Ambulatory Visit: Payer: BC Managed Care – PPO

## 2014-03-02 ENCOUNTER — Ambulatory Visit (INDEPENDENT_AMBULATORY_CARE_PROVIDER_SITE_OTHER): Payer: BC Managed Care – PPO

## 2014-03-02 DIAGNOSIS — J309 Allergic rhinitis, unspecified: Secondary | ICD-10-CM

## 2014-03-09 ENCOUNTER — Ambulatory Visit: Payer: BC Managed Care – PPO

## 2014-03-10 ENCOUNTER — Ambulatory Visit (INDEPENDENT_AMBULATORY_CARE_PROVIDER_SITE_OTHER): Payer: BC Managed Care – PPO

## 2014-03-10 DIAGNOSIS — J309 Allergic rhinitis, unspecified: Secondary | ICD-10-CM

## 2014-03-14 ENCOUNTER — Ambulatory Visit (INDEPENDENT_AMBULATORY_CARE_PROVIDER_SITE_OTHER): Payer: BC Managed Care – PPO

## 2014-03-14 DIAGNOSIS — J309 Allergic rhinitis, unspecified: Secondary | ICD-10-CM

## 2014-03-17 ENCOUNTER — Ambulatory Visit: Payer: BC Managed Care – PPO | Admitting: Internal Medicine

## 2014-03-17 ENCOUNTER — Ambulatory Visit (INDEPENDENT_AMBULATORY_CARE_PROVIDER_SITE_OTHER): Payer: BC Managed Care – PPO

## 2014-03-17 DIAGNOSIS — J309 Allergic rhinitis, unspecified: Secondary | ICD-10-CM

## 2014-03-21 ENCOUNTER — Ambulatory Visit: Payer: BC Managed Care – PPO

## 2014-03-22 ENCOUNTER — Ambulatory Visit (INDEPENDENT_AMBULATORY_CARE_PROVIDER_SITE_OTHER): Payer: BC Managed Care – PPO

## 2014-03-22 DIAGNOSIS — J309 Allergic rhinitis, unspecified: Secondary | ICD-10-CM

## 2014-10-17 ENCOUNTER — Encounter: Payer: Self-pay | Admitting: Internal Medicine

## 2014-11-16 ENCOUNTER — Telehealth: Payer: Self-pay | Admitting: Internal Medicine

## 2014-11-16 NOTE — Telephone Encounter (Signed)
Yes

## 2014-11-16 NOTE — Telephone Encounter (Signed)
Samuel Lang hasn't been in since 03/17/14. His vac. Exp. In Feb. So I of course had to discard. Do I consider him d/c'd?

## 2014-11-17 NOTE — Telephone Encounter (Signed)
Noted  

## 2015-01-27 ENCOUNTER — Encounter: Payer: Self-pay | Admitting: Internal Medicine

## 2015-03-13 ENCOUNTER — Ambulatory Visit: Payer: BC Managed Care – PPO | Admitting: Internal Medicine

## 2015-03-30 ENCOUNTER — Encounter: Payer: Self-pay | Admitting: Internal Medicine

## 2015-04-06 ENCOUNTER — Other Ambulatory Visit (INDEPENDENT_AMBULATORY_CARE_PROVIDER_SITE_OTHER): Payer: BC Managed Care – PPO

## 2015-04-06 ENCOUNTER — Ambulatory Visit (INDEPENDENT_AMBULATORY_CARE_PROVIDER_SITE_OTHER): Payer: BC Managed Care – PPO | Admitting: Internal Medicine

## 2015-04-06 ENCOUNTER — Encounter: Payer: Self-pay | Admitting: Internal Medicine

## 2015-04-06 VITALS — BP 140/100 | HR 76 | Temp 98.0°F | Resp 16 | Ht 72.0 in | Wt 305.0 lb

## 2015-04-06 DIAGNOSIS — Z0000 Encounter for general adult medical examination without abnormal findings: Secondary | ICD-10-CM

## 2015-04-06 DIAGNOSIS — M1990 Unspecified osteoarthritis, unspecified site: Secondary | ICD-10-CM | POA: Diagnosis not present

## 2015-04-06 DIAGNOSIS — R7989 Other specified abnormal findings of blood chemistry: Secondary | ICD-10-CM | POA: Diagnosis not present

## 2015-04-06 LAB — COMPREHENSIVE METABOLIC PANEL
ALT: 61 U/L — ABNORMAL HIGH (ref 0–53)
AST: 33 U/L (ref 0–37)
Albumin: 4.5 g/dL (ref 3.5–5.2)
Alkaline Phosphatase: 56 U/L (ref 39–117)
BUN: 13 mg/dL (ref 6–23)
CHLORIDE: 102 meq/L (ref 96–112)
CO2: 24 meq/L (ref 19–32)
CREATININE: 0.85 mg/dL (ref 0.40–1.50)
Calcium: 10.1 mg/dL (ref 8.4–10.5)
GFR: 103.57 mL/min (ref >60.00)
Glucose, Bld: 105 mg/dL — ABNORMAL HIGH (ref 70–99)
POTASSIUM: 4.2 meq/L (ref 3.5–5.1)
SODIUM: 136 meq/L (ref 135–145)
Total Bilirubin: 0.7 mg/dL (ref 0.2–1.2)
Total Protein: 7.8 g/dL (ref 6.0–8.3)

## 2015-04-06 LAB — LIPID PANEL
CHOL/HDL RATIO: 7
Cholesterol: 251 mg/dL — ABNORMAL HIGH (ref 0–200)
HDL: 37.3 mg/dL — ABNORMAL LOW (ref >39.00)
NONHDL: 213.51
Triglycerides: 269 mg/dL — ABNORMAL HIGH (ref 0.0–149.0)
VLDL: 53.8 mg/dL — AB (ref 0.0–40.0)

## 2015-04-06 LAB — CBC
HCT: 49.6 % (ref 39.0–52.0)
Hemoglobin: 17 g/dL (ref 13.0–17.0)
MCHC: 34.3 g/dL (ref 30.0–36.0)
MCV: 86.6 fl (ref 78.0–100.0)
PLATELETS: 194 10*3/uL (ref 150.0–400.0)
RBC: 5.73 Mil/uL (ref 4.22–5.81)
RDW: 13.2 % (ref 11.5–15.5)
WBC: 9.2 10*3/uL (ref 4.0–10.5)

## 2015-04-06 LAB — LDL CHOLESTEROL, DIRECT: LDL DIRECT: 182 mg/dL

## 2015-04-06 LAB — TSH: TSH: 3.59 u[IU]/mL (ref 0.35–4.50)

## 2015-04-06 LAB — VITAMIN B12: VITAMIN B 12: 603 pg/mL (ref 211–911)

## 2015-04-06 LAB — HEMOGLOBIN A1C: HEMOGLOBIN A1C: 5.3 % (ref 4.6–6.5)

## 2015-04-06 MED ORDER — MELOXICAM 15 MG PO TABS: 15.0000 mg | ORAL_TABLET | Freq: Every day | ORAL | Status: DC

## 2015-04-06 NOTE — Progress Notes (Signed)
Pre visit review using our clinic review tool, if applicable. No additional management support is needed unless otherwise documented below in the visit note. 

## 2015-04-06 NOTE — Progress Notes (Signed)
   Subjective:    Patient ID: Samuel Lang, male    DOB: January 04, 1971, 44 y.o.   MRN: [id number]  HPI The patient is a new 44 YO man coming in for several problems. He is concerned about his weight and has not been able to lose weight. He has gained about 45 pounds in the last 5 years. Does not exercise much. Diet is fair but does not know what it should be. Has never talked to a nutritionist. Has a dental appliance for his snoring and has been checked for sleep apnea in the past which was negative. Next problem is arthritis in a lot of his joints. Has worsened in the last 5 years and he is not sure if the weight has to do with it. Taking a [place] herb which has helped immensely and also does chiropractor to keep his back doing well. Takes ibuprofen rarely. Pain 4/10 when present. Denies >20 minutes stiffness in the morning. No family history of inflammatory arthritis and has seen rheumatology in the past and been tested for those.   [facility], social history reviewed and updated.   Review of Systems  Constitutional: Positive for unexpected weight change. Negative for fever, activity change, appetite change and fatigue.  HENT: Negative.   Eyes: Negative.   Respiratory: Negative for cough, chest tightness, shortness of breath and wheezing.   Cardiovascular: Negative for chest pain, palpitations and leg swelling.  Gastrointestinal: Negative for nausea, abdominal pain, diarrhea, constipation, blood in stool and abdominal distention.  Musculoskeletal: Positive for back pain and arthralgias. Negative for myalgias and gait problem.  Skin: Negative.   Neurological: Negative.   Psychiatric/Behavioral: Negative.       Objective:   Physical Exam  Constitutional: He is oriented to person, place, and time. He appears well-developed and well-nourished.  Overweight  HENT:  Head: Normocephalic and atraumatic.  Eyes: EOM are normal.  Neck: Normal range of motion.  Cardiovascular: Normal rate and regular  rhythm.   Pulmonary/Chest: Effort normal and breath sounds normal. No respiratory distress. He has no wheezes. He has no rales.  Abdominal: Soft. Bowel sounds are normal. He exhibits no distension. There is no tenderness. There is no rebound.  Musculoskeletal: He exhibits no edema.  Neurological: He is alert and oriented to person, place, and time. Coordination normal.  Skin: Skin is warm and dry.  Psychiatric: He has a normal mood and affect.   Filed Vitals:   04/06/15 0819 04/06/15 0913  BP: 144/92 140/100  Pulse: 76   Temp: 98 F (36.7 C)   TempSrc: Oral   Resp: 16   Height: 6' (1.829 m)   Weight: 305 lb (138.347 kg)   SpO2: 97%       Assessment & Plan:

## 2015-04-06 NOTE — Assessment & Plan Note (Signed)
Referral placed for nutrition and talked with him about the needed exercise for weight loss. Checking for complications with labs. BP moderately elevated today and he wants to try diet and lifestyle changes for 3-6 months. BP checks at home are 130s/80s generally.

## 2015-04-06 NOTE — Assessment & Plan Note (Signed)
Rx for meloxicam. Can continue using chinese herbs if desired. Talked to him about weight loss as being a therapy as well. He will work on it.

## 2015-04-06 NOTE — Patient Instructions (Addendum)
We have sent in a medicine for the arthritis called meloxicam which is a once daily medicine. You can take it as needed for the pain and stiffness in the joints.   You can safely use melatonin for sleep when needed.   We have put in the referral for the nutritionist and you will get a call about scheduling. Your insurance will cover this visit and they can help you come up with a plan that fits into your life. When you are trying to lose weight we recommend 45-60 minutes of exercise per day for 5-6 days per week.   Come back in about 6 months and we will check in on the weight (and likely the cholesterol).   Health Maintenance A healthy lifestyle and preventative care can promote health and wellness.  Maintain regular health, dental, and eye exams.  Eat a healthy diet. Foods like vegetables, fruits, whole grains, low-fat dairy products, and lean protein foods contain the nutrients you need and are low in calories. Decrease your intake of foods high in solid fats, added sugars, and salt. Get information about a proper diet from your health care provider, if necessary.  Regular physical exercise is one of the most important things you can do for your health. Most adults should get at least 150 minutes of moderate-intensity exercise (any activity that increases your heart rate and causes you to sweat) each week. In addition, most adults need muscle-strengthening exercises on 2 or more days a week.   Maintain a healthy weight. The body mass index (BMI) is a screening tool to identify possible weight problems. It provides an estimate of body fat based on height and weight. Your health care provider can find your BMI and can help you achieve or maintain a healthy weight. For males 20 years and older:  A BMI below 18.5 is considered underweight.  A BMI of 18.5 to 24.9 is normal.  A BMI of 25 to 29.9 is considered overweight.  A BMI of 30 and above is considered obese.  Maintain normal blood  lipids and cholesterol by exercising and minimizing your intake of saturated fat. Eat a balanced diet with plenty of fruits and vegetables. Blood tests for lipids and cholesterol should begin at age 31 and be repeated every 5 years. If your lipid or cholesterol levels are high, you are over age 26, or you are at high risk for heart disease, you may need your cholesterol levels checked more frequently.Ongoing high lipid and cholesterol levels should be treated with medicines if diet and exercise are not working.  If you smoke, find out from your health care provider how to quit. If you do not use tobacco, do not start.  Lung cancer screening is recommended for adults aged 5-80 years who are at high risk for developing lung cancer because of a history of smoking. A yearly low-dose CT scan of the lungs is recommended for people who have at least a 30-pack-year history of smoking and are current smokers or have quit within the past 15 years. A pack year of smoking is smoking an average of 1 pack of cigarettes a day for 1 year (for example, a 30-pack-year history of smoking could mean smoking 1 pack a day for 30 years or 2 packs a day for 15 years). Yearly screening should continue until the smoker has stopped smoking for at least 15 years. Yearly screening should be stopped for people who develop a health problem that would prevent them from having lung  cancer treatment.  If you choose to drink alcohol, do not have more than 2 drinks per day. One drink is considered to be 12 oz (360 mL) of beer, 5 oz (150 mL) of wine, or 1.5 oz (45 mL) of liquor.  Avoid the use of street drugs. Do not share needles with anyone. Ask for help if you need support or instructions about stopping the use of drugs.  High blood pressure causes heart disease and increases the risk of stroke. Blood pressure should be checked at least every 1-2 years. Ongoing high blood pressure should be treated with medicines if weight loss and  exercise are not effective.  If you are 59-50 years old, ask your health care provider if you should take aspirin to prevent heart disease.  Diabetes screening involves taking a blood sample to check your fasting blood sugar level. This should be done once every 3 years after age 52 if you are at a normal weight and without risk factors for diabetes. Testing should be considered at a younger age or be carried out more frequently if you are overweight and have at least 1 risk factor for diabetes.  Colorectal cancer can be detected and often prevented. Most routine colorectal cancer screening begins at the age of 14 and continues through age 87. However, your health care provider may recommend screening at an earlier age if you have risk factors for colon cancer. On a yearly basis, your health care provider may provide home test kits to check for hidden blood in the stool. A small camera at the end of a tube may be used to directly examine the colon (sigmoidoscopy or colonoscopy) to detect the earliest forms of colorectal cancer. Talk to your health care provider about this at age 26 when routine screening begins. A direct exam of the colon should be repeated every 5-10 years through age 42, unless early forms of precancerous polyps or small growths are found.  People who are at an increased risk for hepatitis B should be screened for this virus. You are considered at high risk for hepatitis B if:  You were born in a country where hepatitis B occurs often. Talk with your health care provider about which countries are considered high risk.  Your parents were born in a high-risk country and you have not received a shot to protect against hepatitis B (hepatitis B vaccine).  You have HIV or AIDS.  You use needles to inject street drugs.  You live with, or have sex with, someone who has hepatitis B.  You are a man who has sex with other men (MSM).  You get hemodialysis treatment.  You take certain  medicines for conditions like cancer, organ transplantation, and autoimmune conditions.  Hepatitis C blood testing is recommended for all people born from [birth year] through 1966 and any individual with known risk factors for hepatitis C.  Healthy men should no longer receive prostate-specific antigen (PSA) blood tests as part of routine cancer screening. Talk to your health care provider about prostate cancer screening.  Testicular cancer screening is not recommended for adolescents or adult males who have no symptoms. Screening includes self-exam, a health care provider exam, and other screening tests. Consult with your health care provider about any symptoms you have or any concerns you have about testicular cancer.  Practice safe sex. Use condoms and avoid high-risk sexual practices to reduce the spread of sexually transmitted infections (STIs).  You should be screened for STIs, including gonorrhea and chlamydia  if:  You are sexually active and are younger than 24 years.  You are older than 24 years, and your health care provider tells you that you are at risk for this type of infection.  Your sexual activity has changed since you were last screened, and you are at an increased risk for chlamydia or gonorrhea. Ask your health care provider if you are at risk.  If you are at risk of being infected with HIV, it is recommended that you take a prescription medicine daily to prevent HIV infection. This is called pre-exposure prophylaxis (PrEP). You are considered at risk if:  You are a man who has sex with other men (MSM).  You are a heterosexual man who is sexually active with multiple partners.  You take drugs by injection.  You are sexually active with a partner who has HIV.  Talk with your health care provider about whether you are at high risk of being infected with HIV. If you choose to begin PrEP, you should first be tested for HIV. You should then be tested every 3 months for as long as  you are taking PrEP.  Use sunscreen. Apply sunscreen liberally and repeatedly throughout the day. You should seek shade when your shadow is shorter than you. Protect yourself by wearing long sleeves, pants, a wide-brimmed hat, and sunglasses year round whenever you are outdoors.  Tell your health care provider of new moles or changes in moles, especially if there is a change in shape or color. Also, tell your health care provider if a mole is larger than the size of a pencil eraser.  A one-time screening for abdominal aortic aneurysm (AAA) and surgical repair of large AAAs by ultrasound is recommended for men aged 63-75 years who are current or former smokers.  Stay current with your vaccines (immunizations). Document Released: 01/18/2008 Document Revised: 07/27/2013 Document Reviewed: 12/17/2010 [facility] Patient Information 2015 [place], [place]. This information is not intended to replace advice given to you by your health care provider. Make sure you discuss any questions you have with your health care provider.

## 2015-07-25 ENCOUNTER — Ambulatory Visit: Payer: BC Managed Care – PPO | Admitting: Dietician

## 2015-08-10 ENCOUNTER — Ambulatory Visit: Payer: Self-pay | Admitting: Internal Medicine

## 2015-09-27 ENCOUNTER — Encounter: Payer: Self-pay | Admitting: Internal Medicine

## 2016-01-22 ENCOUNTER — Telehealth: Payer: Self-pay | Admitting: Internal Medicine

## 2016-01-22 NOTE — Telephone Encounter (Signed)
documented

## 2016-01-29 ENCOUNTER — Ambulatory Visit (INDEPENDENT_AMBULATORY_CARE_PROVIDER_SITE_OTHER): Payer: Managed Care, Other (non HMO) | Admitting: Internal Medicine

## 2016-01-29 ENCOUNTER — Encounter: Payer: Self-pay | Admitting: Internal Medicine

## 2016-01-29 ENCOUNTER — Other Ambulatory Visit (INDEPENDENT_AMBULATORY_CARE_PROVIDER_SITE_OTHER): Payer: Managed Care, Other (non HMO)

## 2016-01-29 VITALS — BP 132/90 | HR 75 | Temp 98.7°F | Resp 18 | Ht 72.0 in | Wt 293.0 lb

## 2016-01-29 DIAGNOSIS — E785 Hyperlipidemia, unspecified: Secondary | ICD-10-CM | POA: Diagnosis not present

## 2016-01-29 DIAGNOSIS — F4321 Adjustment disorder with depressed mood: Secondary | ICD-10-CM

## 2016-01-29 DIAGNOSIS — I10 Essential (primary) hypertension: Secondary | ICD-10-CM

## 2016-01-29 DIAGNOSIS — E669 Obesity, unspecified: Secondary | ICD-10-CM

## 2016-01-29 DIAGNOSIS — Z23 Encounter for immunization: Secondary | ICD-10-CM | POA: Diagnosis not present

## 2016-01-29 LAB — LIPID PANEL
CHOL/HDL RATIO: 6
CHOLESTEROL: 228 mg/dL — AB (ref 0–200)
HDL: 40.7 mg/dL (ref >39.00)
LDL CALC: 155 mg/dL — AB (ref 0–99)
NONHDL: 187.67
TRIGLYCERIDES: 161 mg/dL — AB (ref 0.0–149.0)
VLDL: 32.2 mg/dL (ref 0.0–40.0)

## 2016-01-29 LAB — COMPREHENSIVE METABOLIC PANEL
ALBUMIN: 4.4 g/dL (ref 3.5–5.2)
ALT: 34 U/L (ref 0–53)
AST: 26 U/L (ref 0–37)
Alkaline Phosphatase: 43 U/L (ref 39–117)
BILIRUBIN TOTAL: 0.8 mg/dL (ref 0.2–1.2)
BUN: 11 mg/dL (ref 6–23)
CHLORIDE: 105 meq/L (ref 96–112)
CO2: 25 meq/L (ref 19–32)
CREATININE: 0.82 mg/dL (ref 0.40–1.50)
Calcium: 9.4 mg/dL (ref 8.4–10.5)
GFR: 107.57 mL/min (ref >60.00)
Glucose, Bld: 108 mg/dL — ABNORMAL HIGH (ref 70–99)
Potassium: 4.1 mEq/L (ref 3.5–5.1)
SODIUM: 138 meq/L (ref 135–145)
Total Protein: 7.3 g/dL (ref 6.0–8.3)

## 2016-01-29 LAB — HEMOGLOBIN A1C: HEMOGLOBIN A1C: 5.4 % (ref 4.6–6.5)

## 2016-01-29 MED ORDER — HYDROCHLOROTHIAZIDE 25 MG PO TABS: 25.0000 mg | ORAL_TABLET | Freq: Every day | ORAL | Status: DC

## 2016-01-29 MED ORDER — DULOXETINE HCL 30 MG PO CPEP: 30.0000 mg | ORAL_CAPSULE | Freq: Every day | ORAL | Status: DC

## 2016-01-29 NOTE — Assessment & Plan Note (Signed)
Rx for cymbalta as this is weight neutral. He has done well on this in the past.

## 2016-01-29 NOTE — Progress Notes (Signed)
Pre visit review using our clinic review tool, if applicable. No additional management support is needed unless otherwise documented below in the visit note. 

## 2016-01-29 NOTE — Assessment & Plan Note (Signed)
Rx for new medicine hctz 25 mg daily. Checking CMP today.

## 2016-01-29 NOTE — Patient Instructions (Signed)
We have sent in the blood pressure medicine hctz and will check the labs today.  We have sent in cymbalta at the starting dose which is 30 mg daily. In some people this is enough to help. You should start to notice the difference in about 3-4 weeks. If you are not feeling better you can increase to 60 mg daily by taking 2 pills daily which is the typical dose most people end up on. If you increase just send [place] a mychart message and we can send in the higher strength capsules.

## 2016-01-29 NOTE — Progress Notes (Signed)
   Subjective:    Patient ID: Samuel Lang, male    DOB: Jan 12, 1971, 45 y.o.   MRN: [id number]  HPI The patient is a 45 YO man coming in for follow up of his cholesterol (working on more exercise and losing weight, down about 10 pounds) and his weight (down about 10 pounds since last visit, working out more and dietary changes). He was told that he has a hernia by his scrotum by the urologist since last visit but they did not think he needed to do anything with it.  Next new concern is that his work had diagnosed him with hypertension and started him on hctz. He is out the last 4-5 days but had been taking it without problems since May. No side effects and not significant change to frequency of urination.  Next concern is his mood. He has been on antidepressants over the years. He has changed jobs recently and feels the symptoms starting. He did well with cymbalta several years ago. Also did well on zoloft but stopped due to weight gain.   Review of Systems  Constitutional: Negative for fever, activity change, appetite change, fatigue and unexpected weight change.       Losing weight intentionally  HENT: Negative.   Eyes: Negative.   Respiratory: Negative for cough, chest tightness, shortness of breath and wheezing.   Cardiovascular: Negative for chest pain, palpitations and leg swelling.  Gastrointestinal: Negative for nausea, abdominal pain, diarrhea, constipation, blood in stool and abdominal distention.  Musculoskeletal: Positive for arthralgias. Negative for myalgias, back pain and gait problem.  Skin: Negative.   Neurological: Negative.   Psychiatric/Behavioral: Positive for dysphoric mood and decreased concentration. Negative for sleep disturbance. The patient is not nervous/anxious.       Objective:   Physical Exam  Constitutional: He is oriented to person, place, and time. He appears well-developed and well-nourished.  Overweight  HENT:  Head: Normocephalic and atraumatic.    Eyes: EOM are normal.  Neck: Normal range of motion.  Cardiovascular: Normal rate and regular rhythm.   Pulmonary/Chest: Effort normal and breath sounds normal. No respiratory distress. He has no wheezes. He has no rales.  Abdominal: Soft. Bowel sounds are normal. He exhibits no distension. There is no tenderness. There is no rebound.  Musculoskeletal: He exhibits no edema.  Neurological: He is alert and oriented to person, place, and time. Coordination normal.  Skin: Skin is warm and dry.  Psychiatric:  Some flat affect   Filed Vitals:   01/29/16 1033  BP: 132/90  Pulse: 75  Temp: 98.7 F (37.1 C)  TempSrc: Oral  Resp: 18  Height: 6' (1.829 m)  Weight: 293 lb (132.904 kg)  SpO2: 97%      Assessment & Plan:  Tdap given at visit.

## 2016-01-29 NOTE — Assessment & Plan Note (Signed)
Checking lipid panel, has lost about 10 pounds since last visit.

## 2016-01-29 NOTE — Assessment & Plan Note (Signed)
Weight is down and BMI now <40. He will continue to work on weight loss and maintenance.

## 2016-02-12 ENCOUNTER — Encounter: Payer: Self-pay | Admitting: Internal Medicine

## 2016-02-14 NOTE — Telephone Encounter (Signed)
Left message for pt to call back & schedule direct colon.

## 2016-02-15 ENCOUNTER — Encounter: Payer: Self-pay | Admitting: Internal Medicine

## 2016-02-19 ENCOUNTER — Encounter: Payer: Self-pay | Admitting: Internal Medicine

## 2016-02-20 MED ORDER — DULOXETINE HCL 60 MG PO CPEP: 60.0000 mg | ORAL_CAPSULE | Freq: Every day | ORAL | Status: DC

## 2016-04-05 ENCOUNTER — Ambulatory Visit (AMBULATORY_SURGERY_CENTER): Payer: Self-pay

## 2016-04-05 VITALS — Ht 72.0 in | Wt 290.0 lb

## 2016-04-05 DIAGNOSIS — Z860100 Personal history of colon polyps, unspecified: Secondary | ICD-10-CM

## 2016-04-05 MED ORDER — SUPREP BOWEL PREP KIT 17.5-3.13-1.6 GM/177ML PO SOLN: 1.0000 | Freq: Once | ORAL | 0 refills | Status: AC

## 2016-04-05 NOTE — Progress Notes (Signed)
No allergies to eggs or soy No past problems with anesthesia No diet meds No home oxygen  Has email and internet; declined emmi 

## 2016-04-09 ENCOUNTER — Encounter: Payer: Self-pay | Admitting: Internal Medicine

## 2016-04-17 ENCOUNTER — Ambulatory Visit (AMBULATORY_SURGERY_CENTER): Payer: Managed Care, Other (non HMO) | Admitting: Internal Medicine

## 2016-04-17 ENCOUNTER — Encounter: Payer: Self-pay | Admitting: Internal Medicine

## 2016-04-17 VITALS — BP 139/87 | HR 80 | Temp 98.6°F | Resp 12 | Ht 72.0 in | Wt 290.0 lb

## 2016-04-17 DIAGNOSIS — D122 Benign neoplasm of ascending colon: Secondary | ICD-10-CM

## 2016-04-17 DIAGNOSIS — Z86010 Personal history of colonic polyps: Secondary | ICD-10-CM

## 2016-04-17 MED ORDER — SODIUM CHLORIDE 0.9 % IV SOLN: 500.0000 mL | INTRAVENOUS | Status: DC

## 2016-04-17 NOTE — Patient Instructions (Signed)
YOU HAD AN ENDOSCOPIC PROCEDURE TODAY AT [facility]:   Refer to the procedure report that was given to you for any specific questions about what was found during the examination.  If the procedure report does not answer your questions, please call your gastroenterologist to clarify.  If you requested that your care partner not be given the details of your procedure findings, then the procedure report has been included in a sealed envelope for you to review at your convenience later.  YOU SHOULD EXPECT: Some feelings of bloating in the abdomen. Passage of more gas than usual.  Walking can help get rid of the air that was put into your GI tract during the procedure and reduce the bloating. If you had a lower endoscopy (such as a colonoscopy or flexible sigmoidoscopy) you may notice spotting of blood in your stool or on the toilet paper. If you underwent a bowel prep for your procedure, you may not have a normal bowel movement for a few days.  Please Note:  You might notice some irritation and congestion in your nose or some drainage.  This is from the oxygen used during your procedure.  There is no need for concern and it should clear up in a day or so.  SYMPTOMS TO REPORT IMMEDIATELY:   Following lower endoscopy (colonoscopy or flexible sigmoidoscopy):  Excessive amounts of blood in the stool  Significant tenderness or worsening of abdominal pains  Swelling of the abdomen that is new, acute  Fever of 100F or higher   For urgent or emergent issues, a gastroenterologist can be reached at any hour by calling [phone].   DIET:  We do recommend a small meal at first, but then you may proceed to your regular diet.  Drink plenty of fluids but you should avoid alcoholic beverages for 24 hours.  ACTIVITY:  You should plan to take it easy for the rest of today and you should NOT DRIVE or use heavy machinery until tomorrow (because of the sedation medicines used during the test).     FOLLOW UP: Our staff will call the number listed on your records the next business day following your procedure to check on you and address any questions or concerns that you may have regarding the information given to you following your procedure. If we do not reach you, we will leave a message.  However, if you are feeling well and you are not experiencing any problems, there is no need to return our call.  We will assume that you have returned to your regular daily activities without incident.  If any biopsies were taken you will be contacted by phone or by letter within the next 1-3 weeks.  Please call us at [phone] if you have not heard about the biopsies in 3 weeks.    SIGNATURES/CONFIDENTIALITY: You and/or your care partner have signed paperwork which will be entered into your electronic medical record.  These signatures attest to the fact that that the information above on your After Visit Summary has been reviewed and is understood.  Full responsibility of the confidentiality of this discharge information lies with you and/or your care-partner.  Polyps, diverticulosis, hemorrhoids-handouts given  Repeat colonoscopy in 5 years 2022.

## 2016-04-17 NOTE — Progress Notes (Signed)
Called to room to assist during endoscopic procedure.  Patient ID and intended procedure confirmed with present staff. Received instructions for my participation in the procedure from the performing physician.  

## 2016-04-17 NOTE — Op Note (Signed)
[facility] Patient Name: Samuel Lang Procedure Date: 04/17/2016 1:25 PM MRN: [id number] Endoscopist: Docia Chuck. Henrene Pastor , MD Age: 45 Referring MD:  Date of Birth: 02-Oct-1970 Gender: Male Account #: [id number] Procedure:                Colonoscopy, with cold snare polypectomy x 1 Indications:              Surveillance: Personal history of adenomatous                            polyps on last colonoscopy 5 years ago. Index exam                            with Dr. Earlean Shawl 2012. Rectal tubular adenoma,                            left-sided diverticulosis, nonspecific cecal                            inflammation, and normal ileum Medicines:                Monitored Anesthesia Care Procedure:                Pre-Anesthesia Assessment:                           - Prior to the procedure, a History and Physical                            was performed, and patient medications and                            allergies were reviewed. The patient's tolerance of                            previous anesthesia was also reviewed. The risks                            and benefits of the procedure and the sedation                            options and risks were discussed with the patient.                            All questions were answered, and informed consent                            was obtained. Prior Anticoagulants: The patient has                            taken no previous anticoagulant or antiplatelet                            agents. ASA Grade Assessment: II - A patient with  mild systemic disease. After reviewing the risks                            and benefits, the patient was deemed in                            satisfactory condition to undergo the procedure.                           After obtaining informed consent, the colonoscope                            was passed under direct vision. Throughout the                            procedure, the  patient's blood pressure, pulse, and                            oxygen saturations were monitored continuously. The                            Model CF-HQ190L [id number]) scope was introduced                            through the anus and advanced to the the cecum,                            identified by appendiceal orifice and ileocecal                            valve. The ileocecal valve, appendiceal orifice,                            and rectum were photographed. The quality of the                            bowel preparation was excellent. The colonoscopy                            was performed without difficulty. The patient                            tolerated the procedure well. The bowel preparation                            used was SUPREP. Scope In: 1:31:51 PM Scope Out: 1:46:56 PM Scope Withdrawal Time: 0 hours 12 minutes 25 seconds  Total Procedure Duration: 0 hours 15 minutes 5 seconds  Findings:                 A 4 mm polyp was found in the distal ascending                            colon. The polyp was removed with a cold snare.  Resection and retrieval were complete.                           Multiple small and large-mouthed diverticula were                            found in the left colon.                           External hemorrhoids were found during retroflexion.                           The exam was otherwise without abnormality on                            direct and retroflexion views. Complications:            No immediate complications. Estimated blood loss:                            None. Estimated Blood Loss:     Estimated blood loss: none. Impression:               - One 4 mm polyp in the ascending colon in the                            distal ascending colon, removed with a cold snare.                            Resected and retrieved.                           - Diverticulosis in the left colon.                            - External hemorrhoids.                           - The examination was otherwise normal on direct                            and retroflexion views. Recommendation:           - Repeat colonoscopy in 5 years for surveillance.                           - Patient has a contact number available for                            emergencies. The signs and symptoms of potential                            delayed complications were discussed with the                            patient. Return to normal activities tomorrow.  Written discharge instructions were provided to the                            patient.                           - Resume previous diet.                           - Continue present medications.                           - Await pathology results. Docia Chuck. Henrene Pastor, MD 04/17/2016 1:52:37 PM This report has been signed electronically.

## 2016-04-17 NOTE — Progress Notes (Signed)
To PACU  Awake and Alert. Report to RN 

## 2016-04-18 ENCOUNTER — Telehealth: Payer: Self-pay

## 2016-04-18 NOTE — Telephone Encounter (Signed)
  Follow up Call-  Call back number 04/17/2016  Post procedure Call Back phone  # [phone]  Permission to leave phone message Yes  Some recent data might be hidden    Patient was called for follow up after his procedure on 04/17/2016. No answer at the number given for follow up phone call. A message was left on the answering machine.

## 2016-04-22 ENCOUNTER — Encounter: Payer: Self-pay | Admitting: Internal Medicine

## 2016-07-03 ENCOUNTER — Ambulatory Visit: Payer: Managed Care, Other (non HMO) | Admitting: Internal Medicine

## 2016-09-19 ENCOUNTER — Other Ambulatory Visit: Payer: Self-pay | Admitting: Internal Medicine

## 2016-10-21 ENCOUNTER — Other Ambulatory Visit: Payer: Self-pay | Admitting: Internal Medicine

## 2016-12-05 ENCOUNTER — Other Ambulatory Visit (INDEPENDENT_AMBULATORY_CARE_PROVIDER_SITE_OTHER): Payer: Managed Care, Other (non HMO)

## 2016-12-05 ENCOUNTER — Encounter: Payer: Self-pay | Admitting: Internal Medicine

## 2016-12-05 ENCOUNTER — Ambulatory Visit (INDEPENDENT_AMBULATORY_CARE_PROVIDER_SITE_OTHER): Payer: Managed Care, Other (non HMO) | Admitting: Internal Medicine

## 2016-12-05 VITALS — BP 124/84 | HR 86 | Temp 98.4°F | Resp 14 | Ht 72.0 in | Wt 301.0 lb

## 2016-12-05 DIAGNOSIS — E785 Hyperlipidemia, unspecified: Secondary | ICD-10-CM | POA: Diagnosis not present

## 2016-12-05 DIAGNOSIS — Z0000 Encounter for general adult medical examination without abnormal findings: Secondary | ICD-10-CM

## 2016-12-05 DIAGNOSIS — F4321 Adjustment disorder with depressed mood: Secondary | ICD-10-CM

## 2016-12-05 DIAGNOSIS — I10 Essential (primary) hypertension: Secondary | ICD-10-CM

## 2016-12-05 LAB — COMPREHENSIVE METABOLIC PANEL
ALBUMIN: 4.8 g/dL (ref 3.5–5.2)
ALK PHOS: 61 U/L (ref 39–117)
ALT: 45 U/L (ref 0–53)
AST: 29 U/L (ref 0–37)
BILIRUBIN TOTAL: 0.8 mg/dL (ref 0.2–1.2)
BUN: 11 mg/dL (ref 6–23)
CALCIUM: 9.9 mg/dL (ref 8.4–10.5)
CO2: 30 mEq/L (ref 19–32)
Chloride: 100 mEq/L (ref 96–112)
Creatinine, Ser: 0.95 mg/dL (ref 0.40–1.50)
GFR: 90.43 mL/min (ref >60.00)
Glucose, Bld: 110 mg/dL — ABNORMAL HIGH (ref 70–99)
POTASSIUM: 3.8 meq/L (ref 3.5–5.1)
Sodium: 138 mEq/L (ref 135–145)
TOTAL PROTEIN: 8.1 g/dL (ref 6.0–8.3)

## 2016-12-05 LAB — LIPID PANEL
Cholesterol: 264 mg/dL — ABNORMAL HIGH (ref 0–200)
HDL: 51 mg/dL (ref >39.00)
LDL Cholesterol: 186 mg/dL — ABNORMAL HIGH (ref 0–99)
NonHDL: 212.71
TRIGLYCERIDES: 132 mg/dL (ref 0.0–149.0)
Total CHOL/HDL Ratio: 5
VLDL: 26.4 mg/dL (ref 0.0–40.0)

## 2016-12-05 LAB — CBC
HCT: 50.5 % (ref 39.0–52.0)
Hemoglobin: 17.4 g/dL — ABNORMAL HIGH (ref 13.0–17.0)
MCHC: 34.4 g/dL (ref 30.0–36.0)
MCV: 88.7 fl (ref 78.0–100.0)
PLATELETS: 210 10*3/uL (ref 150.0–400.0)
RBC: 5.69 Mil/uL (ref 4.22–5.81)
RDW: 14 % (ref 11.5–15.5)
WBC: 9 10*3/uL (ref 4.0–10.5)

## 2016-12-05 LAB — HEMOGLOBIN A1C: HEMOGLOBIN A1C: 5.6 % (ref 4.6–6.5)

## 2016-12-05 NOTE — Assessment & Plan Note (Signed)
Checking labs, EKG baseline done which is normal, sun safety discussed and mole check done. Counseled about seatbelt usage and dangers of distracted driving. Given screening recommendations.

## 2016-12-05 NOTE — Assessment & Plan Note (Signed)
Weight is up some from last year and he is working to decrease. He is doing tennis for exercise. Working on diet.

## 2016-12-05 NOTE — Progress Notes (Signed)
Pre visit review using our clinic review tool, if applicable. No additional management support is needed unless otherwise documented below in the visit note. 

## 2016-12-05 NOTE — Assessment & Plan Note (Signed)
Taking cymbalta which is still effective and he would like to continue.

## 2016-12-05 NOTE — Progress Notes (Signed)
   Subjective:    Patient ID: Samuel Lang, male    DOB: June 23, 1971, 46 y.o.   MRN: [id number]  HPI The patient is a 46 YO man coming in for wellness. No new concerns. Small hernia on his stomach around his umbilicus.   PMH, [facility], social history reviewed and updated.   Review of Systems  Constitutional: Negative.   HENT: Negative.   Eyes: Negative.   Respiratory: Negative for cough, chest tightness and shortness of breath.   Cardiovascular: Negative for chest pain, palpitations and leg swelling.  Gastrointestinal: Negative for abdominal distention, abdominal pain, constipation, diarrhea, nausea and vomiting.  Musculoskeletal: Negative.   Skin: Negative.   Neurological: Negative.   Psychiatric/Behavioral: Negative.       Objective:   Physical Exam  Constitutional: He is oriented to person, place, and time. He appears well-developed and well-nourished.  Overweight  HENT:  Head: Normocephalic and atraumatic.  Eyes: EOM are normal.  Neck: Normal range of motion.  Cardiovascular: Normal rate and regular rhythm.   Pulmonary/Chest: Effort normal and breath sounds normal. No respiratory distress. He has no wheezes. He has no rales.  Abdominal: Soft. Bowel sounds are normal. He exhibits no distension. There is no tenderness. There is no rebound.  Musculoskeletal: He exhibits no edema.  Neurological: He is alert and oriented to person, place, and time. Coordination normal.  Skin: Skin is warm and dry.  3 moles on the back, non cancerous appearing  Psychiatric: He has a normal mood and affect.   Vitals:   12/05/16 0807  BP: 124/84  Pulse: 86  Resp: 14  Temp: 98.4 F (36.9 C)  TempSrc: Oral  SpO2: 99%  Weight: (!) 301 lb (136.5 kg)  Height: 6' (1.829 m)   EKG: Rate 83, axis normal, intervals normal, sinus, no st or t wave changes, no prior to compare.    Assessment & Plan:

## 2016-12-05 NOTE — Assessment & Plan Note (Signed)
BP at goal on hctz 25 mg daily, checking CMP and adjust as needed.

## 2016-12-05 NOTE — Patient Instructions (Signed)
Your EKG is perfectly normal, we will check the labs today.   Keep up the good work with exercise.    Health Maintenance, Male A healthy lifestyle and preventive care is important for your health and wellness. Ask your health care provider about what schedule of regular examinations is right for you. What should I know about weight and diet?  Eat a Healthy Diet  Eat plenty of vegetables, fruits, whole grains, low-fat dairy products, and lean protein.  Do not eat a lot of foods high in solid fats, added sugars, or salt. Maintain a Healthy Weight  Regular exercise can help you achieve or maintain a healthy weight. You should:  Do at least 150 minutes of exercise each week. The exercise should increase your heart rate and make you sweat (moderate-intensity exercise).  Do strength-training exercises at least twice a week. Watch Your Levels of Cholesterol and Blood Lipids  Have your blood tested for lipids and cholesterol every 5 years starting at 47 years of age. If you are at high risk for heart disease, you should start having your blood tested when you are 47 years old. You may need to have your cholesterol levels checked more often if:  Your lipid or cholesterol levels are high.  You are older than 47 years of age.  You are at high risk for heart disease. What should I know about cancer screening? Many types of cancers can be detected early and may often be prevented. Lung Cancer  You should be screened every year for lung cancer if:  You are a current smoker who has smoked for at least 30 years.  You are a former smoker who has quit within the past 15 years.  Talk to your health care provider about your screening options, when you should start screening, and how often you should be screened. Colorectal Cancer  Routine colorectal cancer screening usually begins at 47 years of age and should be repeated every 5-10 years until you are 47 years old. You may need to be screened  more often if early forms of precancerous polyps or small growths are found. Your health care provider may recommend screening at an earlier age if you have risk factors for colon cancer.  Your health care provider may recommend using home test kits to check for hidden blood in the stool.  A small camera at the end of a tube can be used to examine your colon (sigmoidoscopy or colonoscopy). This checks for the earliest forms of colorectal cancer. Prostate and Testicular Cancer  Depending on your age and overall health, your health care provider may do certain tests to screen for prostate and testicular cancer.  Talk to your health care provider about any symptoms or concerns you have about testicular or prostate cancer. Skin Cancer  Check your skin from head to toe regularly.  Tell your health care provider about any new moles or changes in moles, especially if:  There is a change in a mole's size, shape, or color.  You have a mole that is larger than a pencil eraser.  Always use sunscreen. Apply sunscreen liberally and repeat throughout the day.  Protect yourself by wearing long sleeves, pants, a wide-brimmed hat, and sunglasses when outside. What should I know about heart disease, diabetes, and high blood pressure?  If you are 40-22 years of age, have your blood pressure checked every 3-5 years. If you are 68 years of age or older, have your blood pressure checked every year. You  should have your blood pressure measured twice-once when you are at a hospital or clinic, and once when you are not at a hospital or clinic. Record the average of the two measurements. To check your blood pressure when you are not at a hospital or clinic, you can use:  An automated blood pressure machine at a pharmacy.  A home blood pressure monitor.  Talk to your health care provider about your target blood pressure.  If you are between 67-97 years old, ask your health care provider if you should take  aspirin to prevent heart disease.  Have regular diabetes screenings by checking your fasting blood sugar level.  If you are at a normal weight and have a low risk for diabetes, have this test once every three years after the age of 16.  If you are overweight and have a high risk for diabetes, consider being tested at a younger age or more often.  A one-time screening for abdominal aortic aneurysm (AAA) by ultrasound is recommended for men aged 58-75 years who are current or former smokers. What should I know about preventing infection? Hepatitis B  If you have a higher risk for hepatitis B, you should be screened for this virus. Talk with your health care provider to find out if you are at risk for hepatitis B infection. Hepatitis C  Blood testing is recommended for:  Everyone born from [birth year] through 1966.  Anyone with known risk factors for hepatitis C. Sexually Transmitted Diseases (STDs)  You should be screened each year for STDs including gonorrhea and chlamydia if:  You are sexually active and are younger than 47 years of age.  You are older than 47 years of age and your health care provider tells you that you are at risk for this type of infection.  Your sexual activity has changed since you were last screened and you are at an increased risk for chlamydia or gonorrhea. Ask your health care provider if you are at risk.  Talk with your health care provider about whether you are at high risk of being infected with HIV. Your health care provider may recommend a prescription medicine to help prevent HIV infection. What else can I do?  Schedule regular health, dental, and eye exams.  Stay current with your vaccines (immunizations).  Do not use any tobacco products, such as cigarettes, chewing tobacco, and e-cigarettes. If you need help quitting, ask your health care provider.  Limit alcohol intake to no more than 2 drinks per day. One drink equals 12 ounces of beer, 5 ounces of  wine, or 1 ounces of hard liquor.  Do not use street drugs.  Do not share needles.  Ask your health care provider for help if you need support or information about quitting drugs.  Tell your health care provider if you often feel depressed.  Tell your health care provider if you have ever been abused or do not feel safe at home. This information is not intended to replace advice given to you by your health care provider. Make sure you discuss any questions you have with your health care provider. Document Released: 01/18/2008 Document Revised: 03/20/2016 Document Reviewed: 04/25/2015 Elsevier Interactive Patient Education  2017 [employer].

## 2016-12-05 NOTE — Assessment & Plan Note (Signed)
Checking lipid panel, goal LDL <130. Not on meds, adjust as needed.

## 2016-12-10 ENCOUNTER — Other Ambulatory Visit: Payer: Self-pay | Admitting: Internal Medicine

## 2016-12-10 DIAGNOSIS — E785 Hyperlipidemia, unspecified: Secondary | ICD-10-CM

## 2016-12-10 MED ORDER — ROSUVASTATIN CALCIUM 20 MG PO TABS
20.0000 mg | ORAL_TABLET | Freq: Every day | ORAL | 3 refills | Status: DC
Start: 2016-12-10 — End: 2018-02-14

## 2016-12-27 ENCOUNTER — Other Ambulatory Visit: Payer: Self-pay | Admitting: Internal Medicine

## 2017-03-08 ENCOUNTER — Other Ambulatory Visit: Payer: Self-pay | Admitting: Internal Medicine

## 2017-03-10 ENCOUNTER — Other Ambulatory Visit: Payer: Self-pay | Admitting: Internal Medicine

## 2017-03-10 DIAGNOSIS — F4321 Adjustment disorder with depressed mood: Secondary | ICD-10-CM

## 2017-03-10 MED ORDER — DULOXETINE HCL 60 MG PO CPEP: 60.0000 mg | ORAL_CAPSULE | Freq: Every day | ORAL | 1 refills | Status: DC

## 2017-03-10 NOTE — Telephone Encounter (Signed)
Routing to dr jones, please advise in the absence of dr crawford, thanks 

## 2017-12-18 ENCOUNTER — Encounter: Payer: Managed Care, Other (non HMO) | Admitting: Internal Medicine

## 2017-12-18 DIAGNOSIS — Z0289 Encounter for other administrative examinations: Secondary | ICD-10-CM

## 2018-02-14 ENCOUNTER — Other Ambulatory Visit: Payer: Self-pay | Admitting: Internal Medicine

## 2018-05-25 ENCOUNTER — Other Ambulatory Visit: Payer: Self-pay | Admitting: Family Medicine

## 2018-05-25 DIAGNOSIS — K909 Intestinal malabsorption, unspecified: Secondary | ICD-10-CM

## 2018-05-28 ENCOUNTER — Ambulatory Visit
Admission: RE | Admit: 2018-05-28 | Discharge: 2018-05-28 | Disposition: A | Discharge status: Home / Self Care | Payer: BLUE CROSS/BLUE SHIELD | Source: Ambulatory Visit | Attending: Family Medicine | Admitting: Family Medicine

## 2018-05-28 ENCOUNTER — Other Ambulatory Visit: Payer: Self-pay

## 2018-05-28 DIAGNOSIS — K909 Intestinal malabsorption, unspecified: Secondary | ICD-10-CM

## 2019-01-26 ENCOUNTER — Encounter: Payer: Self-pay | Admitting: Internal Medicine

## 2019-01-26 ENCOUNTER — Telehealth: Payer: Self-pay | Admitting: Hematology

## 2019-01-26 NOTE — Telephone Encounter (Signed)
A new hem appt has been scheduled for the pt to see Dr. Walden Field on 7/9 at 10:50am. Pt's vm was full. Letter mailed to the pt and faxed to the referring to notify the pt.

## 2019-02-11 ENCOUNTER — Inpatient Hospital Stay: Payer: BC Managed Care – PPO | Attending: Internal Medicine | Admitting: Internal Medicine

## 2019-02-11 ENCOUNTER — Other Ambulatory Visit: Payer: Self-pay

## 2019-02-11 ENCOUNTER — Encounter: Payer: Self-pay | Admitting: Internal Medicine

## 2019-02-11 ENCOUNTER — Inpatient Hospital Stay: Payer: BC Managed Care – PPO

## 2019-02-11 VITALS — BP 159/104 | HR 81 | Temp 98.2°F | Resp 18 | Ht 72.0 in | Wt 332.8 lb

## 2019-02-11 DIAGNOSIS — M1990 Unspecified osteoarthritis, unspecified site: Secondary | ICD-10-CM | POA: Diagnosis not present

## 2019-02-11 DIAGNOSIS — K5730 Diverticulosis of large intestine without perforation or abscess without bleeding: Secondary | ICD-10-CM | POA: Diagnosis not present

## 2019-02-11 DIAGNOSIS — K644 Residual hemorrhoidal skin tags: Secondary | ICD-10-CM | POA: Diagnosis not present

## 2019-02-11 DIAGNOSIS — R7989 Other specified abnormal findings of blood chemistry: Secondary | ICD-10-CM | POA: Diagnosis not present

## 2019-02-11 DIAGNOSIS — R945 Abnormal results of liver function studies: Secondary | ICD-10-CM | POA: Insufficient documentation

## 2019-02-11 DIAGNOSIS — E785 Hyperlipidemia, unspecified: Secondary | ICD-10-CM | POA: Diagnosis not present

## 2019-02-11 DIAGNOSIS — E119 Type 2 diabetes mellitus without complications: Secondary | ICD-10-CM | POA: Insufficient documentation

## 2019-02-11 DIAGNOSIS — M25559 Pain in unspecified hip: Secondary | ICD-10-CM | POA: Diagnosis not present

## 2019-02-11 DIAGNOSIS — N400 Enlarged prostate without lower urinary tract symptoms: Secondary | ICD-10-CM | POA: Insufficient documentation

## 2019-02-11 DIAGNOSIS — I10 Essential (primary) hypertension: Secondary | ICD-10-CM | POA: Diagnosis not present

## 2019-02-11 DIAGNOSIS — D751 Secondary polycythemia: Secondary | ICD-10-CM | POA: Diagnosis not present

## 2019-02-11 DIAGNOSIS — Z79899 Other long term (current) drug therapy: Secondary | ICD-10-CM | POA: Insufficient documentation

## 2019-02-11 LAB — CMP (CANCER CENTER ONLY)
ALT: 90 U/L — ABNORMAL HIGH (ref 0–44)
AST: 76 U/L — ABNORMAL HIGH (ref 15–41)
Albumin: 3.9 g/dL (ref 3.5–5.0)
Alkaline Phosphatase: 74 U/L (ref 38–126)
Anion gap: 8 (ref 5–15)
BUN: 7 mg/dL (ref 6–20)
CO2: 24 mmol/L (ref 22–32)
Calcium: 8.6 mg/dL — ABNORMAL LOW (ref 8.9–10.3)
Chloride: 108 mmol/L (ref 98–111)
Creatinine: 0.73 mg/dL (ref 0.61–1.24)
GFR, Est AFR Am: >60 mL/min (ref >60)
GFR, Estimated: >60 mL/min (ref >60)
Glucose, Bld: 146 mg/dL — ABNORMAL HIGH (ref 70–99)
Potassium: 4.1 mmol/L (ref 3.5–5.1)
Sodium: 140 mmol/L (ref 135–145)
Total Bilirubin: 0.6 mg/dL (ref 0.3–1.2)
Total Protein: 7.6 g/dL (ref 6.5–8.1)

## 2019-02-11 LAB — CBC WITH DIFFERENTIAL (CANCER CENTER ONLY)
Abs Immature Granulocytes: 0.06 10*3/uL (ref 0.00–0.07)
Basophils Absolute: 0.1 10*3/uL (ref 0.0–0.1)
Basophils Relative: 1 %
Eosinophils Absolute: 0.2 10*3/uL (ref 0.0–0.5)
Eosinophils Relative: 4 %
HCT: 52.4 % — ABNORMAL HIGH (ref 39.0–52.0)
Hemoglobin: 17.8 g/dL — ABNORMAL HIGH (ref 13.0–17.0)
Immature Granulocytes: 1 %
Lymphocytes Relative: 28 %
Lymphs Abs: 1.9 10*3/uL (ref 0.7–4.0)
MCH: 30.3 pg (ref 26.0–34.0)
MCHC: 34 g/dL (ref 30.0–36.0)
MCV: 89.3 fL (ref 80.0–100.0)
Monocytes Absolute: 0.5 10*3/uL (ref 0.1–1.0)
Monocytes Relative: 8 %
Neutro Abs: 4.1 10*3/uL (ref 1.7–7.7)
Neutrophils Relative %: 58 %
Platelet Count: 136 10*3/uL — ABNORMAL LOW (ref 150–400)
RBC: 5.87 MIL/uL — ABNORMAL HIGH (ref 4.22–5.81)
RDW: 12.9 % (ref 11.5–15.5)
WBC Count: 6.9 10*3/uL (ref 4.0–10.5)
nRBC: 0 % (ref 0.0–0.2)

## 2019-02-11 LAB — IRON AND TIBC
Iron: 82 ug/dL (ref 42–163)
Saturation Ratios: 23 % (ref 20–55)
TIBC: 356 ug/dL (ref 202–409)
UIBC: 275 ug/dL (ref 117–376)

## 2019-02-11 LAB — FERRITIN: Ferritin: 387 ng/mL — ABNORMAL HIGH (ref 24–336)

## 2019-02-11 LAB — LACTATE DEHYDROGENASE: LDH: 229 U/L — ABNORMAL HIGH (ref 98–192)

## 2019-02-11 NOTE — Progress Notes (Signed)
Referring Physician:  Horald Pollen, MD  Diagnosis Polycythemia, secondary - Plan: CBC with Differential ([place] Only), CMP ([place] only), Lactate dehydrogenase (LDH), Ferritin, Iron and TIBC, Erythropoietin, Jak 2 Exon 12 ([facility]), Jak 2 V617F ([facility]), Hemochromatosis DNA, PCR, Rheumatoid factor, ANA, IFA (with reflex), SPEP with reflex to IFE  Pain in joint involving pelvic region and thigh, unspecified laterality - Plan: CBC with Differential ([place] Only), CMP ([place] only), Lactate dehydrogenase (LDH), Ferritin, Iron and TIBC, Erythropoietin, Jak 2 Exon 12 ([facility]), Jak 2 V617F ([facility]), Hemochromatosis DNA, PCR, Rheumatoid factor, ANA, IFA (with reflex), SPEP with reflex to IFE  Elevated LFTs - Plan: CBC with Differential ([place] Only), CMP ([place] only), Lactate dehydrogenase (LDH), Ferritin, Iron and TIBC, Erythropoietin, Jak 2 Exon 12 ([facility]), Jak 2 V617F ([facility]), Hemochromatosis DNA, PCR, Rheumatoid factor, ANA, IFA (with reflex), SPEP with reflex to IFE  Staging Cancer Staging No matching staging information was found for the patient.  Assessment and Plan:  1.  Polycythemia.  48 year old male referred for evaluation due to elevated Hb and HCT.  Pt denies smoking.  He is on testosterone supplementation.  He denies any family history of blood disorders other than an grandmother who was diagnosed with leukemia when she was older.  Pt had labs done 01/15/2019 that showed WBC 9.9 HB 18.4 HCT 53.5 plts 173,000.  K+ 4 Cr 0.79 elevated LFTs with ALT of 82 and AST of 77.  Pt had [facility] Covid testing that was negative.  Labs done 12/05/2016 showed WBC 9 HB 17.4 HCT 50.5 and plts 210,000.  Chemistries showed K+ 3.8 Cr 0.95 AST 29 and ALT of 45.  Pt reports joint pain and back pain.  He is concerned about his weight.  Pt had colonoscopy 04/17/2016 that showed  - One 4 mm polyp in the ascending colon in the distal ascending colon, removed with a cold snare.  Resected and retrieved. - Diverticulosis in the left colon. - External hemorrhoids.  Pathology returned as a tubular adenoma no dysplasia or malignancy was identified.    Pt is seen today for consultation due to polycythemia.    Labs done today 02/11/2019 reviewed and showed WBC 6.9 HB 17.8 HCT 52.4 plts 136,000.  Chemistries showed K+ 4.1 Cr 0.73 elevated LFTs with AST of 76 and ALT of 90.  Ferritin 387 with TS of 23.  Awaiting results of EPO,  JAK 2 and hemochromatosis evaluation.  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  Pt will have phone visit follow-up in 2 weeks to go over results.    2.  Elevated LFTs.  Labs done today 02/11/2019 showed AST of 76 and ALT of 90.  Pt had USN done 05/28/2018 that showed fatty liver.  Awaiting results of hemochromatosis gene evaluation.  Pt also currently on Crestor.  Follow-up with PCP for monitoring.    3.  Joint pain.  Awaiting results of SPEP, RF and ANA.  Follow-up with PCP if ongoing symptoms.    4.  Hypertension.  BP is 159/104. Pt on Benicar.   Follow-up with PCP for monitoring.  5.  Obesity.  Pt weighs 332 pounds.  Pt should follow-up with PCP to discuss weight management options such as weight management clinic.    6.  Health maintenance.  Pt had colonoscopy done 04/17/2016.  Follow-up with GI as directed.    40 minutes spent with more than 50% spent in review of records, counseling and coordination of care.  Oncology History   No history exists.   HPI:  48 year old male referred for evaluation due to elevated Hb and HCT.  Pt denies smoking.  He is on testosterone supplementation.  He denies any family history of blood disorders other than an grandmother who was diagnosed with leukemia when she was older.  Pt had labs done 01/15/2019 that showed WBC 9.9 HB 18.4 HCT 53.5 plts 173,000.  K+ 4 Cr 0.79 elevated LFTs with ALT of 82 and AST of 77.  Pt had [facility] Covid testing that was negative.  Labs done 12/05/2016 showed WBC 9  HB 17.4 HCT 50.5 and plts 210,000.  Chemistries showed K+ 3.8 Cr 0.95 AST 29 and ALT of 45.  Pt reports joint pain and back pain.  He is concerned about his weight.  Pt had colonoscopy 04/17/2016 that showed  - One 4 mm polyp in the ascending colon in the distal ascending colon, removed with a cold snare. Resected and retrieved. - Diverticulosis in the left colon. - External hemorrhoids.  Pathology returned as a tubular adenoma no dysplasia or malignancy was identified.    Pt is seen today for consultation due to polycythemia.    Problem List Patient Active Problem List   Diagnosis Date Noted  . Routine general medical examination at a health care facility [Z00.00] 12/05/2016  . Hyperlipidemia [E78.5] 01/29/2016  . Essential hypertension [I10] 01/29/2016  . Adjustment disorder with depressed mood [F43.21] 01/29/2016  . Arthritis [M19.90] 04/06/2015  . Morbid obesity ([place]) [E66.01] 04/06/2015  . Allergic rhinitis due to pollen [J30.1] 10/10/2010  . Asthma, mild intermittent, well-controlled [J45.20] 04/01/2008    Past Medical History Past Medical History:  Diagnosis Date  . Allergic rhinitis   . Arthritis   . Asthma   . Colon polyps   . Depression   . Diabetes mellitus without complication ([place])   . Diverticulitis   . Enlarged prostate   . Hernia, umbilical   . Hyperlipidemia   . Hypertension   . UTI (lower urinary tract infection)     Past Surgical History Past Surgical History:  Procedure Laterality Date  . traumatic pneumothorax sledding accident,rib rx, left pneumothorax, chest tube      Family History Family History  Problem Relation Age of Onset  . Lung cancer Mother   . Stroke Father   . Diabetes Father   . Allergies Brother   . Colon cancer Neg Hx      Social History  reports that he has never smoked. He has never used smokeless tobacco. He reports current alcohol use of about 10.0 standard drinks of alcohol per week. He reports that he does not use  drugs.  Medications  Current Outpatient Medications:  .  amoxicillin (AMOXIL) 875 MG tablet, , Disp: , Rfl:  .  BYSTOLIC 10 MG tablet, , Disp: , Rfl:  .  DULoxetine (CYMBALTA) 60 MG capsule, Take 1 capsule (60 mg total) by mouth daily., Disp: 90 capsule, Rfl: 1 .  finasteride (PROSCAR) 5 MG tablet, Take 5 mg by mouth daily., Disp: , Rfl:  .  olmesartan-hydrochlorothiazide (BENICAR HCT) 20-12.5 MG tablet, , Disp: , Rfl:  .  rosuvastatin (CRESTOR) 20 MG tablet, Take 1 tablet (20 mg total) by mouth daily. Needs annual appointment for further refills, Disp: 90 tablet, Rfl: 0  Allergies Patient has no known allergies.  Review of Systems Review of Systems - Oncology ROS negative   Physical Exam  Vitals Wt Readings from Last 3 Encounters:  02/11/19 Marland Kitchen)  332 lb 12.8 oz (151 kg)  12/05/16 (!) 301 lb (136.5 kg)  04/17/16 290 lb (131.5 kg)   Temp Readings from Last 3 Encounters:  02/11/19 98.2 F (36.8 C) (Oral)  12/05/16 98.4 F (36.9 C) (Oral)  04/17/16 98.6 F (37 C)   BP Readings from Last 3 Encounters:  02/11/19 (!) 159/104  12/05/16 124/84  04/17/16 139/87   Pulse Readings from Last 3 Encounters:  02/11/19 81  12/05/16 86  04/17/16 80   Constitutional: Obese in no distress.   HENT: Head: Normocephalic and atraumatic.  Mouth/Throat: No oropharyngeal exudate. Mucosa moist. Eyes: Pupils are equal, round, and reactive to light. Conjunctivae are normal. No scleral icterus.  Neck: Normal range of motion. Neck supple. No JVD present.  Cardiovascular: Normal rate, regular rhythm and normal heart sounds.  Exam reveals no gallop and no friction rub.   No murmur heard. Pulmonary/Chest: Effort normal and breath sounds normal. No respiratory distress. No wheezes.No rales.  Abdominal: Soft. Bowel sounds are normal. Obese.  Umbilical hernia.  There is no tenderness. There is no guarding.  Musculoskeletal: No edema or tenderness.  Lymphadenopathy: No cervical,axillary or  supraclavicular adenopathy.  Neurological: Alert and oriented to person, place, and time. No cranial nerve deficit.  Skin: Skin is warm and dry. No rash noted. No erythema. No pallor.  Psychiatric: Affect and judgment normal.   Labs Appointment on 02/11/2019  Component Date Value Ref Range Status  . Iron 02/11/2019 82  42 - 163 ug/dL Final  . TIBC 02/11/2019 356  202 - 409 ug/dL Final  . Saturation Ratios 02/11/2019 23  20 - 55 % Final  . UIBC 02/11/2019 275  117 - 376 ug/dL Final   Performed at [facility], [place] [address] [place], [place] [address]  . Ferritin 02/11/2019 387* 24 - 336 ng/mL Final   Performed at [facility], [place] [address] [place], [place] [address]  . LDH 02/11/2019 229* 98 - 192 U/L Final   Performed at [facility], [place] [address], [place], [place] [address]  . Sodium 02/11/2019 140  135 - 145 mmol/L Final  . Potassium 02/11/2019 4.1  3.5 - 5.1 mmol/L Final  . Chloride 02/11/2019 108  98 - 111 mmol/L Final  . CO2 02/11/2019 24  22 - 32 mmol/L Final  . Glucose, Bld 02/11/2019 146* 70 - 99 mg/dL Final  . BUN 02/11/2019 7  6 - 20 mg/dL Final  . Creatinine 02/11/2019 0.73  0.61 - 1.24 mg/dL Final  . Calcium 02/11/2019 8.6* 8.9 - 10.3 mg/dL Final  . Total Protein 02/11/2019 7.6  6.5 - 8.1 g/dL Final  . Albumin 02/11/2019 3.9  3.5 - 5.0 g/dL Final  . AST 02/11/2019 76* 15 - 41 U/L Final  . ALT 02/11/2019 90* 0 - 44 U/L Final  . Alkaline Phosphatase 02/11/2019 74  38 - 126 U/L Final  . Total Bilirubin 02/11/2019 0.6  0.3 - 1.2 mg/dL Final  . GFR, Est Non Af Am 02/11/2019 >60  >60 mL/min Final  . GFR, Est AFR Am 02/11/2019 >60  >60 mL/min Final  . Anion gap 02/11/2019 8  5 - 15 Final   Performed at [facility], [place] [address], [place], [place] [address]  . WBC Count 02/11/2019 6.9  4.0 - 10.5 K/uL Final  . RBC 02/11/2019 5.87* 4.22 - 5.81 MIL/uL Final  . Hemoglobin  02/11/2019 17.8* 13.0 - 17.0 g/dL Final  .  HCT 02/11/2019 52.4* 39.0 - 52.0 % Final  . MCV 02/11/2019 89.3  80.0 - 100.0 fL Final  . MCH 02/11/2019 30.3  26.0 - 34.0 pg Final  . MCHC 02/11/2019 34.0  30.0 - 36.0 g/dL Final  . RDW 02/11/2019 12.9  11.5 - 15.5 % Final  . Platelet Count 02/11/2019 136* 150 - 400 K/uL Final  . nRBC 02/11/2019 0.0  0.0 - 0.2 % Final  . Neutrophils Relative % 02/11/2019 58  % Final  . Neutro Abs 02/11/2019 4.1  1.7 - 7.7 K/uL Final  . Lymphocytes Relative 02/11/2019 28  % Final  . Lymphs Abs 02/11/2019 1.9  0.7 - 4.0 K/uL Final  . Monocytes Relative 02/11/2019 8  % Final  . Monocytes Absolute 02/11/2019 0.5  0.1 - 1.0 K/uL Final  . Eosinophils Relative 02/11/2019 4  % Final  . Eosinophils Absolute 02/11/2019 0.2  0.0 - 0.5 K/uL Final  . Basophils Relative 02/11/2019 1  % Final  . Basophils Absolute 02/11/2019 0.1  0.0 - 0.1 K/uL Final  . Immature Granulocytes 02/11/2019 1  % Final  . Abs Immature Granulocytes 02/11/2019 0.06  0.00 - 0.07 K/uL Final   Performed at [facility], [place] [address], [place], [place] [address]     Pathology Orders Placed This Encounter  Procedures  . CBC with Differential ([facility] Only)    Standing Status:   Future    Number of Occurrences:   1    Standing Expiration Date:   02/11/2020  . CMP ([place] only)    Standing Status:   Future    Number of Occurrences:   1    Standing Expiration Date:   02/11/2020  . Lactate dehydrogenase (LDH)    Standing Status:   Future    Number of Occurrences:   1    Standing Expiration Date:   02/11/2020  . Ferritin    Standing Status:   Future    Number of Occurrences:   1    Standing Expiration Date:   02/11/2020  . Iron and TIBC    Standing Status:   Future    Number of Occurrences:   1    Standing Expiration Date:   02/11/2020  . Erythropoietin    Standing Status:   Future    Number of Occurrences:   1    Standing Expiration Date:   02/11/2020  . Jak 2  Exon 12 ([facility])    Standing Status:   Future    Number of Occurrences:   1    Standing Expiration Date:   02/11/2020  . Jak 2 V617F ([facility])    Standing Status:   Future    Number of Occurrences:   1    Standing Expiration Date:   02/11/2020  . Hemochromatosis DNA, PCR    Standing Status:   Future    Number of Occurrences:   1    Standing Expiration Date:   02/11/2020  . Rheumatoid factor    Standing Status:   Future    Number of Occurrences:   1    Standing Expiration Date:   02/11/2020  . ANA, IFA (with reflex)    Standing Status:   Future    Number of Occurrences:   1    Standing Expiration Date:   02/11/2020  . SPEP with reflex to IFE    Standing Status:   Future    Number of Occurrences:   1  Standing Expiration Date:   02/11/2020       Zoila Shutter MD

## 2019-02-12 LAB — ANTINUCLEAR ANTIBODIES, IFA: ANA Ab, IFA: NEGATIVE

## 2019-02-12 LAB — RHEUMATOID FACTOR: Rheumatoid fact SerPl-aCnc: <10 IU/mL (ref 0.0–13.9)

## 2019-02-12 LAB — ERYTHROPOIETIN: Erythropoietin: 20 m[IU]/mL — ABNORMAL HIGH (ref 2.6–18.5)

## 2019-02-15 ENCOUNTER — Telehealth: Payer: Self-pay | Admitting: Internal Medicine

## 2019-02-15 LAB — PROTEIN ELECTROPHORESIS, SERUM, WITH REFLEX
A/G Ratio: 1.3 (ref 0.7–1.7)
Albumin ELP: 4 g/dL (ref 2.9–4.4)
Alpha-1-Globulin: 0.1 g/dL (ref 0.0–0.4)
Alpha-2-Globulin: 0.4 g/dL (ref 0.4–1.0)
Beta Globulin: 1.5 g/dL — ABNORMAL HIGH (ref 0.7–1.3)
Gamma Globulin: 0.9 g/dL (ref 0.4–1.8)
Globulin, Total: 3 g/dL (ref 2.2–3.9)
Total Protein ELP: 7 g/dL (ref 6.0–8.5)

## 2019-02-15 NOTE — Telephone Encounter (Signed)
Called and spoke with patient. Confirmed upcoming phone visit

## 2019-02-22 LAB — HEMOCHROMATOSIS DNA-PCR(C282Y,H63D)

## 2019-02-24 ENCOUNTER — Telehealth: Payer: Self-pay | Admitting: Internal Medicine

## 2019-02-25 ENCOUNTER — Telehealth: Payer: Self-pay | Admitting: *Deleted

## 2019-02-25 ENCOUNTER — Inpatient Hospital Stay (HOSPITAL_BASED_OUTPATIENT_CLINIC_OR_DEPARTMENT_OTHER): Payer: BC Managed Care – PPO | Admitting: Internal Medicine

## 2019-02-25 DIAGNOSIS — E668 Other obesity: Secondary | ICD-10-CM

## 2019-02-25 DIAGNOSIS — R945 Abnormal results of liver function studies: Secondary | ICD-10-CM

## 2019-02-25 DIAGNOSIS — I10 Essential (primary) hypertension: Secondary | ICD-10-CM | POA: Diagnosis not present

## 2019-02-25 DIAGNOSIS — D751 Secondary polycythemia: Secondary | ICD-10-CM | POA: Diagnosis not present

## 2019-02-25 DIAGNOSIS — M2550 Pain in unspecified joint: Secondary | ICD-10-CM | POA: Diagnosis not present

## 2019-02-25 DIAGNOSIS — R7989 Other specified abnormal findings of blood chemistry: Secondary | ICD-10-CM

## 2019-02-25 NOTE — Progress Notes (Signed)
Virtual Visit via Telephone Note  I connected with Samuel Lang on 02/25/19 at  3:00 PM EDT by telephone and verified that I am speaking with the correct person using two identifiers.   I discussed the limitations, risks, security and privacy concerns of performing an evaluation and management service by telephone and the availability of in person appointments. I also discussed with the patient that there may be a patient responsible charge related to this service. The patient expressed understanding and agreed to proceed.  Interval History.  Historical data obtained from note dated 02/11/2019.  48 year old male referred for evaluation due to elevated Hb and HCT.  Pt denies smoking.  He is on testosterone supplementation.  He denies any family history of blood disorders other than an grandmother who was diagnosed with leukemia when she was older.  Pt had labs done 01/15/2019 that showed WBC 9.9 HB 18.4 HCT 53.5 plts 173,000.  K+ 4 Cr 0.79 elevated LFTs with ALT of 82 and AST of 77.  Pt had [facility] Covid testing that was negative.  Labs done 12/05/2016 showed WBC 9 HB 17.4 HCT 50.5 and plts 210,000.  Chemistries showed K+ 3.8 Cr 0.95 AST 29 and ALT of 45.  Pt reports joint pain and back pain.  He is concerned about his weight.  Pt had colonoscopy 04/17/2016 that showed  - One 4 mm polyp in the ascending colon in the distal ascending colon, removed with a cold snare. Resected and retrieved. - Diverticulosis in the left colon. - External hemorrhoids.  Pathology returned as a tubular adenoma no dysplasia or malignancy was identified.    Observations/Objective: Review of labs.    Assessment and Plan: 1. Secondary Polycythemia.  48 year old male referred for evaluation due to elevated Hb and HCT.  Pt denies smoking.  He is on testosterone supplementation.  He denies any family history of blood disorders other than an grandmother who was diagnosed with leukemia when she was older.  Pt had labs done 01/15/2019 that  showed WBC 9.9 HB 18.4 HCT 53.5 plts 173,000.  K+ 4 Cr 0.79 elevated LFTs with ALT of 82 and AST of 77.  Pt had [facility] Covid testing that was negative.  Labs done 12/05/2016 showed WBC 9 HB 17.4 HCT 50.5 and plts 210,000.  Chemistries showed K+ 3.8 Cr 0.95 AST 29 and ALT of 45.  Pt reports joint pain and back pain.  He is concerned about his weight.  Pt had colonoscopy 04/17/2016 that showed  - One 4 mm polyp in the ascending colon in the distal ascending colon, removed with a cold snare. Resected and retrieved. - Diverticulosis in the left colon. - External hemorrhoids.  Pathology returned as a tubular adenoma no dysplasia or malignancy was identified.    Labs done 02/11/2019 reviewed and showed WBC 6.9 HB 17.8 HCT 52.4 plts 136,000.  Chemistries showed K+ 4.1 Cr 0.73 elevated LFTs with AST of 76 and ALT of 90.  Ferritin 387 with TS of 23.  EPO level normal at 20.  He has negative JAK 2 and hemochromatosis evaluation.  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  Pt will follow-up with PCP and can be referred back if any change in lab values.  2.  Elevated LFTs.  Labs done  02/11/2019 showed AST of 76 and ALT of 90.  Pt had USN done 05/28/2018 that showed fatty liver.  He has negative hemochromatosis gene evaluation.  Pt also currently on Crestor.  Pt referred to  GI.  Pt should also follow-up with PCP for lab monitoring.    3.  Joint pain.  Pt has negative SPEP, RF and ANA.  Follow-up with PCP if ongoing symptoms.    4.  Hypertension.  BP 159/104. Pt on Benicar.   Follow-up with PCP for monitoring.  5.  Obesity.  Pt weighs 332 pounds.  Pt should follow-up with PCP to discuss weight management options such as weight management clinic.    6.  Health maintenance.  Pt had colonoscopy done 04/17/2016.  Pt referred to GI due to elevated LFTs.    Follow Up Instructions:  Pt referred to GI.  Follow-up with PCP.    I discussed the assessment and treatment plan with the patient. The  patient was provided an opportunity to ask questions and all were answered. The patient agreed with the plan and demonstrated an understanding of the instructions.   The patient was advised to call back or seek an in-person evaluation if the symptoms worsen or if the condition fails to improve as anticipated.  I provided 15 minutes of non-face-to-face time during this encounter.   Zoila Shutter, MD

## 2019-02-25 NOTE — Telephone Encounter (Signed)
Opened in error

## 2019-03-19 ENCOUNTER — Other Ambulatory Visit: Payer: Self-pay | Admitting: Surgery

## 2019-03-19 ENCOUNTER — Ambulatory Visit: Payer: Self-pay | Admitting: Surgery

## 2019-03-19 DIAGNOSIS — R1011 Right upper quadrant pain: Secondary | ICD-10-CM

## 2019-03-22 ENCOUNTER — Other Ambulatory Visit: Payer: Self-pay

## 2019-03-22 DIAGNOSIS — Z20822 Contact with and (suspected) exposure to covid-19: Secondary | ICD-10-CM

## 2019-03-24 LAB — SPECIMEN STATUS REPORT

## 2019-03-24 LAB — NOVEL CORONAVIRUS, NAA: SARS-CoV-2, NAA: NOT DETECTED

## 2019-03-29 ENCOUNTER — Other Ambulatory Visit: Payer: Self-pay

## 2019-03-29 ENCOUNTER — Ambulatory Visit (HOSPITAL_COMMUNITY)
Admission: RE | Admit: 2019-03-29 | Discharge: 2019-03-29 | Disposition: A | Discharge status: Home / Self Care | Payer: BC Managed Care – PPO | Source: Ambulatory Visit | Attending: Surgery | Admitting: Surgery

## 2019-03-29 ENCOUNTER — Ambulatory Visit: Payer: Self-pay | Admitting: Surgery

## 2019-03-29 DIAGNOSIS — R1011 Right upper quadrant pain: Secondary | ICD-10-CM | POA: Insufficient documentation

## 2019-03-29 MED ORDER — TECHNETIUM TC 99M MEBROFENIN IV KIT
5.0000 | PACK | Freq: Once | INTRAVENOUS | Status: AC | PRN
  Administered 2019-03-29: 07:00:00 5 via INTRAVENOUS

## 2019-03-29 NOTE — Progress Notes (Signed)
Please call the patient and let them know that their HIDA scan was completely normal.  We will proceed with umbilical hernia repair.

## 2019-03-29 NOTE — H&P (Signed)
History of Present Illness Samuel Lang. [name] MD; 03/19/2019 3:16 PM) The patient is a 48 year old male who presents with an umbilical hernia. Referred by Dr. Horald Pollen for umbilical hernia  This is a 48 year old male who presents with a 3-year history of an enlarging umbilical hernia. This has become more uncomfortable. The patient has fatty liver and also drinks 2-3 drinks/ day at least four days per week. He has developed increasing swelling over the hernia with some tenderness. The patient also reports some epigastric upper abdominal pain after eating associated with some nausea. The patient continues to gain weight and wants to consider bariatric surgery. He has Dm2, hyperlipidemia, hypertension. He is referred for evaluation of his hernia.  CLINICAL DATA: Intestinal malabsorption.  EXAM: ABDOMEN ULTRASOUND COMPLETE  COMPARISON: Body CT 06/15/2014  FINDINGS: Gallbladder: No gallstones or wall thickening visualized. No sonographic Murphy sign noted by sonographer.  Common bile duct: Diameter: 4.8 mm  Liver: No focal lesion identified. Coarse increased parenchymal echogenicity. Portal vein is patent on color Doppler imaging with normal direction of blood flow towards the liver.  IVC: No abnormality visualized.  Pancreas: Limited visualization. Echogenic appearance of the pancreas, where visualized.  Spleen: Size and appearance within normal limits.  Right Kidney: Length: 13.1 cm. Echogenicity within normal limits. No mass or hydronephrosis visualized.  Left Kidney: Length: 12.0 cm. Echogenicity within normal limits. No mass or hydronephrosis visualized.  Abdominal aorta: Limited visualization. No aneurysm visualized.  Other findings: None.  IMPRESSION: Coarse increased parenchymal echogenicity of the liver, which may be seen with hepatic steatosis or fibrosis.  Echogenic pancreas, usually associated with fatty degeneration of the  pancreas.   Electronically Signed By: Fidela Salisbury M.D. On: 05/28/2018 15:05   Past Surgical History Samuel Lorenzo, LPN; [id number] AM) Colon Polyp Removal - Colonoscopy   Diagnostic Studies History Samuel Lorenzo, LPN; [id number] 579FGE AM) Colonoscopy  1-5 years ago  Allergies Samuel Lorenzo, LPN; X33443 QA348G AM) No Known Drug Allergies  [03/19/2019]:  Medication History Samuel Lorenzo, LPN; X33443 QA348G AM) Bystolic (20MG  Tablet, Oral) Active. DULoxetine HCl (60MG  Capsule DR Part, Oral) Active. Finasteride (5MG  Tablet, Oral) Active. hydroCHLOROthiazide (25MG  Tablet, Oral) Active. Rosuvastatin Calcium (20MG  Tablet, Oral) Active. Tadalafil (5MG  Tablet, Oral) Active. Medications Reconciled  Social History Samuel Lorenzo, LPN; X33443 579FGE AM) Alcohol use  Heavy alcohol use. Caffeine use  Carbonated beverages, Coffee, Tea. Illicit drug use  Uses socially only. Tobacco use  Never smoker.  Family History Samuel Lorenzo, LPN; [id number] 579FGE AM) Alcohol Abuse  Brother, Father. Cancer  Mother. Cerebrovascular Accident  Father. Depression  Brother, Father, Mother. Diabetes Mellitus  Father. Hypertension  Father. Ischemic Bowel Disease  Father. Respiratory Condition  Mother.  Other Problems Samuel Lorenzo, LPN; [id number] 579FGE AM) Alcohol Abuse  Anxiety Disorder  Asthma  Back Pain  Depression  Diabetes Mellitus  Diverticulosis  Enlarged Prostate  Gastroesophageal Reflux Disease  Hemorrhoids  High blood pressure  Hypercholesterolemia  Sleep Apnea  Umbilical Hernia Repair     Review of Systems Samuel Lorenzo LPN; X33443 579FGE AM) General Present- Fatigue and Night Sweats. Not Present- Appetite Loss, Chills, Fever, Weight Gain and Weight Loss. Skin Not Present- Change in Wart/Mole, Dryness, Hives, Jaundice, New Lesions, Non-Healing Wounds, Rash and Ulcer. HEENT Present- Hearing Loss, Hoarseness, Seasonal  Allergies and Wears glasses/contact lenses. Not Present- Earache, Nose Bleed, Oral Ulcers, Ringing in the Ears, Sinus Pain, Sore Throat, Visual Disturbances and Yellow Eyes. Respiratory Present- Chronic Cough, Difficulty Breathing and Wheezing. Not Present-  Bloody sputum and Snoring. Breast Not Present- Breast Mass, Breast Pain, Nipple Discharge and Skin Changes. Cardiovascular Present- Leg Cramps and Shortness of Breath. Not Present- Chest Pain, Difficulty Breathing Lying Down, Palpitations, Rapid Heart Rate and Swelling of Extremities. Gastrointestinal Present- Abdominal Pain, Bloating, Chronic diarrhea, Excessive gas and Hemorrhoids. Not Present- Bloody Stool, Change in Bowel Habits, Constipation, Difficulty Swallowing, Gets full quickly at meals, Indigestion, Nausea, Rectal Pain and Vomiting. Male Genitourinary Not Present- Blood in Urine, Change in Urinary Stream, Frequency, Impotence, Nocturia, Painful Urination, Urgency and Urine Leakage.  Vitals [name] LPN; [id number] [id number] AM) 03/19/2019 9:40 AM Weight: 336.8 lb Height: 72in Body Surface Area: 2.66 m Body Mass Index: 45.68 kg/m  Temp.: 97.81F (Temporal)  Pulse: 88 (Regular)  BP: 158/92(Sitting, Left Arm, Standard)       Physical Exam Rodman Key K. Warrick Llera MD; 03/19/2019 3:14 PM) The physical exam findings are as follows: Note: Obese male in NAD Eyes: Pupils equal, round; sclera anicteric HENT: Oral mucosa moist; good dentition Neck: No masses palpated, no thyromegaly Lungs: CTA bilaterally; normal respiratory effort CV: Regular rate and rhythm; no murmurs; extremities well-perfused with no edema Abd: +bowel sounds, obese, soft, non-tender; periumbilical hernia protruding to the right side; partially reducible when supine GU: significant groin adipose tissue - no palpable inguinal hernia on either side Skin: Warm, dry; no sign of jaundice Psychiatric - alert and oriented x 4; calm mood and affect    Assessment  & Plan Rodman Key K. [name] MD; [id number] PM)  UMBILICAL HERNIA WITHOUT OBSTRUCTION OR GANGRENE (K42.9)   The surgical procedure has been discussed with the patient.  Potential risks, benefits, alternative treatments, and expected outcomes have been explained.  All of the patient's questions at this time have been answered.  The likelihood of reaching the patient's treatment goal is good.  The patient understand the proposed surgical procedure and wishes to proceed.  Samuel Lang. Georgette Dover, MD, [facility] Beeper [phone]  03/29/2019 10:19 AM

## 2019-05-17 LAB — JAK 2 EXON 12 (GENPATH)

## 2019-05-17 LAB — JAK 2 V617F (GENPATH)

## 2019-05-25 NOTE — Progress Notes (Signed)
[facility] - [place], [place] - [address] [place] [address] Phone: [phone] Fax: [phone]  [facility] [place], [address] [place] [address] Phone: [phone] Fax: [phone]  [facility] [address], [place] - [place] [address] Phone: [phone] Fax: [phone]      Your procedure is scheduled on October 27th, 2020.  Report to [facility] Main Entrance "A" at 5:30 A.M., and check in at the Admitting office.  Call this number if you have problems the morning of surgery:  [phone]  Call [phone] if you have any questions prior to your surgery date Monday-Friday 8am-4pm    Remember:  Do not eat after midnight the night before your surgery  You may drink clear liquids until 4:30AM the morning of your surgery.   Clear liquids allowed are: Water, Non-Citrus Juices (without pulp), Carbonated Beverages, Clear Tea, Black Coffee Only, and Gatorade    Take these medicines the morning of surgery with A SIP OF WATER :  Amlodipine (Norvasc) Bystolic Duloxetine (Cymbalta) Finasteride (Proscar) Rosuvastatin (Crestor) Triamcinolone (Nasacort Allergy) nasal inhaler - if needed  7 days prior to surgery STOP taking any Aspirin (unless otherwise instructed by your surgeon), Aleve, Naproxen, Ibuprofen, Motrin, Advil, Goody's, BC's, all herbal medications, fish oil, and all vitamins.    WHAT DO I DO ABOUT MY DIABETES MEDICATION?   Marland Kitchen Do not take oral diabetes medicines (pills) the morning of surgery.   How to Manage Your Diabetes Before and After Surgery  Why is it important to control my blood sugar before and after surgery? . Improving blood sugar levels before and after surgery helps healing and can limit problems. . A way of  improving blood sugar control is eating a healthy diet by: o  Eating less sugar and carbohydrates o  Increasing activity/exercise o  Talking with your doctor about reaching your blood sugar goals . High blood sugars (greater than 180 mg/dL) can raise your risk of infections and slow your recovery, so you will need to focus on controlling your diabetes during the weeks before surgery. . Make sure that the doctor who takes care of your diabetes knows about your planned surgery including the date and location.  How do I manage my blood sugar before surgery? . Check your blood sugar at least 4 times a day, starting 2 days before surgery, to make sure that the level is not too high or low. o Check your blood sugar the morning of your surgery when you wake up and every 2 hours until you get to the Short Stay unit. . If your blood sugar is less than 70 mg/dL, you will need to treat for low blood sugar: o Do not take insulin. o Treat a low blood sugar (less than 70 mg/dL) with  cup of clear juice (cranberry or apple), 4 glucose tablets, OR glucose gel. o Recheck blood sugar in 15 minutes after treatment (to make sure it is greater than 70 mg/dL). If your blood sugar is not greater than 70 mg/dL on recheck, call [phone] for further instructions. . Report your blood sugar to the short stay nurse when you get to Short Stay.  . If you are admitted to the hospital after surgery: o Your blood sugar will be checked by the staff and  you will probably be given insulin after surgery (instead of oral diabetes medicines) to make sure you have good blood sugar levels. o The goal for blood sugar control after surgery is 80-180 mg/dL.   The Morning of Surgery  Do not wear jewelry, make-up or nail polish.  Do not wear lotions, powders, or perfumes/colognes, or deodorant  Do not shave 48 hours prior to surgery.  Men may shave face and neck.  Do not bring valuables to the hospital.  [facility] is not  responsible for any belongings or valuables.  If you are a smoker, DO NOT Smoke 24 hours prior to surgery IF you wear a CPAP at night please bring your mask, tubing, and machine the morning of surgery   Remember that you must have someone to transport you home after your surgery, and remain with you for 24 hours if you are discharged the same day.   Contacts, glasses, hearing aids, dentures or bridgework may not be worn into surgery.    Leave your suitcase in the car.  After surgery it may be brought to your room.  For patients admitted to the hospital, discharge time will be determined by your treatment team.  Patients discharged the day of surgery will not be allowed to drive home.    Special instructions:   [place]- Preparing For Surgery  Before surgery, you can play an important role. Because skin is not sterile, your skin needs to be as free of germs as possible. You can reduce the number of germs on your skin by washing with CHG (chlorahexidine gluconate) Soap before surgery.  CHG is an antiseptic cleaner which kills germs and bonds with the skin to continue killing germs even after washing.    Oral Hygiene is also important to reduce your risk of infection.  Remember - BRUSH YOUR TEETH THE MORNING OF SURGERY WITH YOUR REGULAR TOOTHPASTE  Please do not use if you have an allergy to CHG or antibacterial soaps. If your skin becomes reddened/irritated stop using the CHG.  Do not shave (including legs and underarms) for at least 48 hours prior to first CHG shower. It is OK to shave your face.  Please follow these instructions carefully.   1. Shower the NIGHT BEFORE SURGERY and the MORNING OF SURGERY with CHG Soap.   2. If you chose to wash your hair, wash your hair first as usual with your normal shampoo.  3. After you shampoo, rinse your hair and body thoroughly to remove the shampoo.  4. Use CHG as you would any other liquid soap. You can apply CHG directly to the skin and  wash gently with a scrungie or a clean washcloth.   5. Apply the CHG Soap to your body ONLY FROM THE NECK DOWN.  Do not use on open wounds or open sores. Avoid contact with your eyes, ears, mouth and genitals (private parts). Wash Face and genitals (private parts)  with your normal soap.   6. Wash thoroughly, paying special attention to the area where your surgery will be performed.  7. Thoroughly rinse your body with warm water from the neck down.  8. DO NOT shower/wash with your normal soap after using and rinsing off the CHG Soap.  9. Pat yourself dry with a CLEAN TOWEL.  10. Wear CLEAN PAJAMAS to bed the night before surgery, wear comfortable clothes the morning of surgery  11. Place CLEAN SHEETS on your bed the night of your first shower and DO NOT SLEEP  WITH PETS.    Day of Surgery:  Do not apply any deodorants/lotions. Please shower the morning of surgery with the CHG soap  Please wear clean clothes to the hospital/surgery center.   Remember to brush your teeth WITH YOUR REGULAR TOOTHPASTE.   Please read over the following fact sheets that you were given.

## 2019-05-26 ENCOUNTER — Encounter (HOSPITAL_COMMUNITY): Payer: Self-pay

## 2019-05-26 ENCOUNTER — Encounter (HOSPITAL_COMMUNITY)
Admission: RE | Admit: 2019-05-26 | Discharge: 2019-05-26 | Disposition: A | Discharge status: Home / Self Care | Payer: BC Managed Care – PPO | Source: Ambulatory Visit | Attending: Surgery | Admitting: Surgery

## 2019-05-26 ENCOUNTER — Other Ambulatory Visit: Payer: Self-pay

## 2019-05-26 DIAGNOSIS — I10 Essential (primary) hypertension: Secondary | ICD-10-CM | POA: Diagnosis not present

## 2019-05-26 DIAGNOSIS — Z01818 Encounter for other preprocedural examination: Secondary | ICD-10-CM | POA: Insufficient documentation

## 2019-05-26 DIAGNOSIS — E118 Type 2 diabetes mellitus with unspecified complications: Secondary | ICD-10-CM | POA: Diagnosis not present

## 2019-05-26 HISTORY — DX: Sleep apnea, unspecified: G47.30

## 2019-05-26 HISTORY — DX: Fatty (change of) liver, not elsewhere classified: K76.0

## 2019-05-26 HISTORY — DX: Sciatica, unspecified side: M54.30

## 2019-05-26 LAB — COMPREHENSIVE METABOLIC PANEL
ALT: 69 U/L — ABNORMAL HIGH (ref 0–44)
AST: 75 U/L — ABNORMAL HIGH (ref 15–41)
Albumin: 3.7 g/dL (ref 3.5–5.0)
Alkaline Phosphatase: 65 U/L (ref 38–126)
Anion gap: 10 (ref 5–15)
BUN: 9 mg/dL (ref 6–20)
CO2: 21 mmol/L — ABNORMAL LOW (ref 22–32)
Calcium: 9.1 mg/dL (ref 8.9–10.3)
Chloride: 104 mmol/L (ref 98–111)
Creatinine, Ser: 0.8 mg/dL (ref 0.61–1.24)
GFR calc Af Amer: >60 mL/min (ref >60)
GFR calc non Af Amer: >60 mL/min (ref >60)
Glucose, Bld: 146 mg/dL — ABNORMAL HIGH (ref 70–99)
Potassium: 4.1 mmol/L (ref 3.5–5.1)
Sodium: 135 mmol/L (ref 135–145)
Total Bilirubin: 1 mg/dL (ref 0.3–1.2)
Total Protein: 7.2 g/dL (ref 6.5–8.1)

## 2019-05-26 LAB — CBC
HCT: 53.4 % — ABNORMAL HIGH (ref 39.0–52.0)
Hemoglobin: 17.9 g/dL — ABNORMAL HIGH (ref 13.0–17.0)
MCH: 31.1 pg (ref 26.0–34.0)
MCHC: 33.5 g/dL (ref 30.0–36.0)
MCV: 92.7 fL (ref 80.0–100.0)
Platelets: 163 10*3/uL (ref 150–400)
RBC: 5.76 MIL/uL (ref 4.22–5.81)
RDW: 12.6 % (ref 11.5–15.5)
WBC: 8.2 10*3/uL (ref 4.0–10.5)
nRBC: 0 % (ref 0.0–0.2)

## 2019-05-26 LAB — HEMOGLOBIN A1C
Hgb A1c MFr Bld: 7.7 % — ABNORMAL HIGH (ref 4.8–5.6)
Mean Plasma Glucose: 174.29 mg/dL

## 2019-05-26 LAB — GLUCOSE, CAPILLARY: Glucose-Capillary: 151 mg/dL — ABNORMAL HIGH (ref 70–99)

## 2019-05-26 NOTE — Progress Notes (Signed)
PCP - Dr. Ival Bible Cardiologist - denies  Chest x-ray - N/A EKG - 05/26/2019 Stress Test - denies ECHO - denies Cardiac Cath - denies  Sleep Study - Yes CPAP - Yes, per patient wears every night  Fasting Blood Sugar - per patient does not check blood sugar wasn't given instructions to do so, on metFORMIN only  Blood Thinner Instructions: N/A Aspirin Instructions: Patient instructed to call surgeon's office for instructions on when to stop/resume  ERAS Protcol - Yes PRE-SURGERY Ensure or G2- None ordered  COVID TEST-  Scheduled for Friday 05/28/2019. Patient verbalized understanding of self-quarantine instructions and appointment time and place.  Anesthesia review: No  Patient denies shortness of breath, fever, cough and chest pain at PAT appointment  All instructions explained to the patient, with a verbal understanding of the material. Patient agrees to go over the instructions while at home for a better understanding. Patient also instructed to self quarantine after being tested for COVID-19. The opportunity to ask questions was provided.

## 2019-05-28 ENCOUNTER — Other Ambulatory Visit (HOSPITAL_COMMUNITY)
Admission: RE | Admit: 2019-05-28 | Discharge: 2019-05-28 | Disposition: A | Discharge status: Home / Self Care | Payer: BC Managed Care – PPO | Source: Ambulatory Visit | Attending: Surgery | Admitting: Surgery

## 2019-05-28 DIAGNOSIS — Z20828 Contact with and (suspected) exposure to other viral communicable diseases: Secondary | ICD-10-CM | POA: Insufficient documentation

## 2019-05-28 DIAGNOSIS — Z01812 Encounter for preprocedural laboratory examination: Secondary | ICD-10-CM | POA: Diagnosis present

## 2019-05-29 LAB — NOVEL CORONAVIRUS, NAA (HOSP ORDER, SEND-OUT TO REF LAB; TAT 18-24 HRS): SARS-CoV-2, NAA: NOT DETECTED

## 2019-05-31 MED ORDER — DEXTROSE 5 % IV SOLN
3.0000 g | INTRAVENOUS | Status: AC
  Administered 2019-06-01: 3 g via INTRAVENOUS
  Filled 2019-05-31: qty 3
  Filled 2019-05-31: qty 3000

## 2019-06-01 ENCOUNTER — Ambulatory Visit (HOSPITAL_COMMUNITY)
Admission: RE | Admit: 2019-06-01 | Discharge: 2019-06-01 | Disposition: A | Discharge status: Home / Self Care | Payer: BC Managed Care – PPO | Attending: Surgery | Admitting: Surgery

## 2019-06-01 ENCOUNTER — Encounter (HOSPITAL_COMMUNITY): Payer: Self-pay

## 2019-06-01 ENCOUNTER — Encounter (HOSPITAL_COMMUNITY)
Admission: RE | Disposition: A | Discharge status: Home / Self Care | Payer: Self-pay | Source: Home / Self Care | Attending: Surgery

## 2019-06-01 ENCOUNTER — Ambulatory Visit (HOSPITAL_COMMUNITY): Payer: BC Managed Care – PPO | Admitting: Physician Assistant

## 2019-06-01 ENCOUNTER — Other Ambulatory Visit: Payer: Self-pay

## 2019-06-01 ENCOUNTER — Ambulatory Visit (HOSPITAL_COMMUNITY): Payer: BC Managed Care – PPO | Admitting: Certified Registered Nurse Anesthetist

## 2019-06-01 DIAGNOSIS — E7800 Pure hypercholesterolemia, unspecified: Secondary | ICD-10-CM | POA: Insufficient documentation

## 2019-06-01 DIAGNOSIS — G4730 Sleep apnea, unspecified: Secondary | ICD-10-CM | POA: Insufficient documentation

## 2019-06-01 DIAGNOSIS — K429 Umbilical hernia without obstruction or gangrene: Secondary | ICD-10-CM | POA: Insufficient documentation

## 2019-06-01 DIAGNOSIS — F419 Anxiety disorder, unspecified: Secondary | ICD-10-CM | POA: Diagnosis not present

## 2019-06-01 DIAGNOSIS — E119 Type 2 diabetes mellitus without complications: Secondary | ICD-10-CM | POA: Diagnosis not present

## 2019-06-01 DIAGNOSIS — F329 Major depressive disorder, single episode, unspecified: Secondary | ICD-10-CM | POA: Insufficient documentation

## 2019-06-01 DIAGNOSIS — Z79899 Other long term (current) drug therapy: Secondary | ICD-10-CM | POA: Insufficient documentation

## 2019-06-01 DIAGNOSIS — E785 Hyperlipidemia, unspecified: Secondary | ICD-10-CM | POA: Insufficient documentation

## 2019-06-01 DIAGNOSIS — I10 Essential (primary) hypertension: Secondary | ICD-10-CM | POA: Diagnosis not present

## 2019-06-01 DIAGNOSIS — K760 Fatty (change of) liver, not elsewhere classified: Secondary | ICD-10-CM | POA: Insufficient documentation

## 2019-06-01 DIAGNOSIS — J45909 Unspecified asthma, uncomplicated: Secondary | ICD-10-CM | POA: Insufficient documentation

## 2019-06-01 HISTORY — PX: INSERTION OF MESH: SHX5868

## 2019-06-01 HISTORY — PX: UMBILICAL HERNIA REPAIR: SHX196

## 2019-06-01 LAB — GLUCOSE, CAPILLARY
Glucose-Capillary: 205 mg/dL — ABNORMAL HIGH (ref 70–99)
Glucose-Capillary: 156 mg/dL — ABNORMAL HIGH (ref 70–99)

## 2019-06-01 SURGERY — REPAIR, HERNIA, UMBILICAL, ADULT
Anesthesia: General | Site: Abdomen

## 2019-06-01 MED ORDER — PROPOFOL 10 MG/ML IV BOLUS
INTRAVENOUS | Status: DC | PRN
  Administered 2019-06-01: 200 mg via INTRAVENOUS

## 2019-06-01 MED ORDER — LIDOCAINE 2% (20 MG/ML) 5 ML SYRINGE
INTRAMUSCULAR | Status: DC | PRN
  Administered 2019-06-01: 100 mg via INTRAVENOUS

## 2019-06-01 MED ORDER — FENTANYL CITRATE (PF) 250 MCG/5ML IJ SOLN
INTRAMUSCULAR | Status: AC
  Filled 2019-06-01: qty 5

## 2019-06-01 MED ORDER — CHLORHEXIDINE GLUCONATE CLOTH 2 % EX PADS: 6.0000 | MEDICATED_PAD | Freq: Once | CUTANEOUS | Status: DC

## 2019-06-01 MED ORDER — ONDANSETRON HCL 4 MG/2ML IJ SOLN
INTRAMUSCULAR | Status: AC
  Filled 2019-06-01: qty 2

## 2019-06-01 MED ORDER — PHENYLEPHRINE HCL (PRESSORS) 10 MG/ML IV SOLN
INTRAVENOUS | Status: AC
  Filled 2019-06-01: qty 1

## 2019-06-01 MED ORDER — BUPIVACAINE HCL (PF) 0.25 % IJ SOLN
INTRAMUSCULAR | Status: AC
  Filled 2019-06-01: qty 30

## 2019-06-01 MED ORDER — SUGAMMADEX SODIUM 200 MG/2ML IV SOLN
INTRAVENOUS | Status: DC | PRN
  Administered 2019-06-01: 400 mg via INTRAVENOUS

## 2019-06-01 MED ORDER — SODIUM CHLORIDE 0.9 % IV SOLN
INTRAVENOUS | Status: DC | PRN
  Administered 2019-06-01: 55 ug/min via INTRAVENOUS

## 2019-06-01 MED ORDER — DEXMEDETOMIDINE HCL IN NACL 400 MCG/100ML IV SOLN
INTRAVENOUS | Status: DC | PRN
  Administered 2019-06-01 (×2): 4 ug via INTRAVENOUS

## 2019-06-01 MED ORDER — 0.9 % SODIUM CHLORIDE (POUR BTL) OPTIME
TOPICAL | Status: DC | PRN
  Administered 2019-06-01: 08:00:00 1000 mL

## 2019-06-01 MED ORDER — ONDANSETRON HCL 4 MG/2ML IJ SOLN
INTRAMUSCULAR | Status: DC | PRN
  Administered 2019-06-01: 4 mg via INTRAVENOUS

## 2019-06-01 MED ORDER — EPHEDRINE SULFATE-NACL 50-0.9 MG/10ML-% IV SOSY
PREFILLED_SYRINGE | INTRAVENOUS | Status: DC | PRN
  Administered 2019-06-01: 5 mg via INTRAVENOUS
  Administered 2019-06-01: 10 mg via INTRAVENOUS
  Administered 2019-06-01 (×2): 5 mg via INTRAVENOUS

## 2019-06-01 MED ORDER — ROCURONIUM BROMIDE 50 MG/5ML IV SOSY
PREFILLED_SYRINGE | INTRAVENOUS | Status: DC | PRN
  Administered 2019-06-01: 15 mg via INTRAVENOUS
  Administered 2019-06-01: 30 mg via INTRAVENOUS

## 2019-06-01 MED ORDER — LACTATED RINGERS IV SOLN
INTRAVENOUS | Status: DC | PRN
  Administered 2019-06-01 (×2): via INTRAVENOUS

## 2019-06-01 MED ORDER — LIDOCAINE 2% (20 MG/ML) 5 ML SYRINGE
INTRAMUSCULAR | Status: AC
  Filled 2019-06-01: qty 5

## 2019-06-01 MED ORDER — SUCCINYLCHOLINE CHLORIDE 200 MG/10ML IV SOSY
PREFILLED_SYRINGE | INTRAVENOUS | Status: AC
  Filled 2019-06-01: qty 10

## 2019-06-01 MED ORDER — DEXAMETHASONE SODIUM PHOSPHATE 10 MG/ML IJ SOLN
INTRAMUSCULAR | Status: AC
  Filled 2019-06-01: qty 1

## 2019-06-01 MED ORDER — BUPIVACAINE HCL (PF) 0.25 % IJ SOLN
INTRAMUSCULAR | Status: DC | PRN
  Administered 2019-06-01: 10 mL

## 2019-06-01 MED ORDER — ACETAMINOPHEN 500 MG PO TABS
ORAL_TABLET | ORAL | Status: AC
  Administered 2019-06-01: 1000 mg via ORAL
  Filled 2019-06-01: qty 2

## 2019-06-01 MED ORDER — PHENYLEPHRINE 40 MCG/ML (10ML) SYRINGE FOR IV PUSH (FOR BLOOD PRESSURE SUPPORT)
PREFILLED_SYRINGE | INTRAVENOUS | Status: DC | PRN
  Administered 2019-06-01: 120 ug via INTRAVENOUS
  Administered 2019-06-01 (×2): 60 ug via INTRAVENOUS
  Administered 2019-06-01 (×2): 120 ug via INTRAVENOUS

## 2019-06-01 MED ORDER — BUPIVACAINE-EPINEPHRINE 0.5% -1:200000 IJ SOLN
INTRAMUSCULAR | Status: AC
  Filled 2019-06-01: qty 1

## 2019-06-01 MED ORDER — PHENYLEPHRINE 40 MCG/ML (10ML) SYRINGE FOR IV PUSH (FOR BLOOD PRESSURE SUPPORT)
PREFILLED_SYRINGE | INTRAVENOUS | Status: AC
  Filled 2019-06-01: qty 10

## 2019-06-01 MED ORDER — PROPOFOL 10 MG/ML IV BOLUS
INTRAVENOUS | Status: AC
  Filled 2019-06-01: qty 40

## 2019-06-01 MED ORDER — ROCURONIUM BROMIDE 10 MG/ML (PF) SYRINGE
PREFILLED_SYRINGE | INTRAVENOUS | Status: AC
  Filled 2019-06-01: qty 10

## 2019-06-01 MED ORDER — OXYCODONE HCL 5 MG PO TABS: 5.0000 mg | ORAL_TABLET | Freq: Four times a day (QID) | ORAL | 0 refills | Status: DC | PRN

## 2019-06-01 MED ORDER — FENTANYL CITRATE (PF) 100 MCG/2ML IJ SOLN
INTRAMUSCULAR | Status: DC | PRN
  Administered 2019-06-01 (×3): 50 ug via INTRAVENOUS

## 2019-06-01 MED ORDER — GABAPENTIN 300 MG PO CAPS
300.0000 mg | ORAL_CAPSULE | ORAL | Status: AC
  Administered 2019-06-01: 06:00:00 300 mg via ORAL

## 2019-06-01 MED ORDER — MIDAZOLAM HCL 2 MG/2ML IJ SOLN
INTRAMUSCULAR | Status: AC
  Filled 2019-06-01: qty 2

## 2019-06-01 MED ORDER — DEXAMETHASONE SODIUM PHOSPHATE 10 MG/ML IJ SOLN
INTRAMUSCULAR | Status: DC | PRN
  Administered 2019-06-01: 4 mg via INTRAVENOUS

## 2019-06-01 MED ORDER — GABAPENTIN 300 MG PO CAPS
ORAL_CAPSULE | ORAL | Status: AC
  Administered 2019-06-01: 300 mg via ORAL
  Filled 2019-06-01: qty 1

## 2019-06-01 MED ORDER — ACETAMINOPHEN 500 MG PO TABS
1000.0000 mg | ORAL_TABLET | ORAL | Status: AC
  Administered 2019-06-01: 06:00:00 1000 mg via ORAL

## 2019-06-01 MED ORDER — SUCCINYLCHOLINE CHLORIDE 200 MG/10ML IV SOSY
PREFILLED_SYRINGE | INTRAVENOUS | Status: DC | PRN
  Administered 2019-06-01: 160 mg via INTRAVENOUS

## 2019-06-01 MED ORDER — PROPOFOL 10 MG/ML IV BOLUS
INTRAVENOUS | Status: AC
  Filled 2019-06-01: qty 20

## 2019-06-01 MED ORDER — MIDAZOLAM HCL 2 MG/2ML IJ SOLN
INTRAMUSCULAR | Status: DC | PRN
  Administered 2019-06-01: 2 mg via INTRAVENOUS

## 2019-06-01 SURGICAL SUPPLY — 46 items
APL PRP STRL LF DISP 70% ISPRP (MISCELLANEOUS) ×1
APL SKNCLS STERI-STRIP NONHPOA (GAUZE/BANDAGES/DRESSINGS) ×1
BENZOIN TINCTURE PRP APPL 2/3 (GAUZE/BANDAGES/DRESSINGS) ×3 IMPLANT
BLADE CLIPPER SURG (BLADE) ×2 IMPLANT
CANISTER SUCT 3000ML PPV (MISCELLANEOUS) IMPLANT
CHLORAPREP W/TINT 26 (MISCELLANEOUS) ×3 IMPLANT
CLOSURE STERI-STRIP 1/2X4 (GAUZE/BANDAGES/DRESSINGS) ×1
CLOSURE WOUND 1/2 X4 (GAUZE/BANDAGES/DRESSINGS) ×1
CLSR STERI-STRIP ANTIMIC 1/2X4 (GAUZE/BANDAGES/DRESSINGS) ×1 IMPLANT
COVER SURGICAL LIGHT HANDLE (MISCELLANEOUS) ×3 IMPLANT
COVER WAND RF STERILE (DRAPES) ×1 IMPLANT
DRAPE LAPAROTOMY 100X72 PEDS (DRAPES) ×3 IMPLANT
DRSG TEGADERM 2-3/8X2-3/4 SM (GAUZE/BANDAGES/DRESSINGS) ×2 IMPLANT
DRSG TEGADERM 4X4.75 (GAUZE/BANDAGES/DRESSINGS) ×3 IMPLANT
ELECT CAUTERY BLADE 6.4 (BLADE) IMPLANT
ELECT REM PT RETURN 9FT ADLT (ELECTROSURGICAL) ×3
ELECTRODE REM PT RTRN 9FT ADLT (ELECTROSURGICAL) ×1 IMPLANT
GAUZE 4X4 16PLY RFD (DISPOSABLE) ×3 IMPLANT
GAUZE SPONGE 2X2 8PLY STRL LF (GAUZE/BANDAGES/DRESSINGS) ×1 IMPLANT
GLOVE BIO SURGEON STRL SZ7 (GLOVE) ×3 IMPLANT
GLOVE BIOGEL PI IND STRL 7.5 (GLOVE) ×1 IMPLANT
GLOVE BIOGEL PI INDICATOR 7.5 (GLOVE) ×2
GOWN STRL REUS W/ TWL LRG LVL3 (GOWN DISPOSABLE) ×2 IMPLANT
GOWN STRL REUS W/TWL LRG LVL3 (GOWN DISPOSABLE) ×6
KIT BASIN OR (CUSTOM PROCEDURE TRAY) ×3 IMPLANT
KIT TURNOVER KIT B (KITS) ×3 IMPLANT
MESH VENTRALEX ST 2.5 CRC MED (Mesh General) ×2 IMPLANT
NDL HYPO 25GX1X1/2 BEV (NEEDLE) ×1 IMPLANT
NEEDLE HYPO 25GX1X1/2 BEV (NEEDLE) ×3 IMPLANT
NS IRRIG 1000ML POUR BTL (IV SOLUTION) ×3 IMPLANT
PACK GENERAL/GYN (CUSTOM PROCEDURE TRAY) ×3 IMPLANT
PAD ARMBOARD 7.5X6 YLW CONV (MISCELLANEOUS) ×3 IMPLANT
PENCIL SMOKE EVACUATOR (MISCELLANEOUS) ×3 IMPLANT
SPONGE GAUZE 2X2 8PLY STER LF (GAUZE/BANDAGES/DRESSINGS) ×1
SPONGE GAUZE 2X2 8PLY STRL LF (GAUZE/BANDAGES/DRESSINGS) ×1 IMPLANT
SPONGE GAUZE 2X2 STER 10/PKG (GAUZE/BANDAGES/DRESSINGS) ×2
STRIP CLOSURE SKIN 1/2X4 (GAUZE/BANDAGES/DRESSINGS) ×2 IMPLANT
SUT MNCRL AB 4-0 PS2 18 (SUTURE) ×3 IMPLANT
SUT NOVA NAB DX-16 0-1 5-0 T12 (SUTURE) ×3 IMPLANT
SUT NOVA NAB GS-21 0 18 T12 DT (SUTURE) ×3 IMPLANT
SUT VIC AB 3-0 SH 27 (SUTURE) ×3
SUT VIC AB 3-0 SH 27X BRD (SUTURE) ×1 IMPLANT
SUT VIC AB 4-0 PS2 18 (SUTURE) ×2 IMPLANT
SYR CONTROL 10ML LL (SYRINGE) ×3 IMPLANT
TOWEL GREEN STERILE (TOWEL DISPOSABLE) ×3 IMPLANT
TOWEL GREEN STERILE FF (TOWEL DISPOSABLE) ×3 IMPLANT

## 2019-06-01 NOTE — H&P (Signed)
History of Present Illness  The patient is a 48 year old male who presents with an umbilical hernia. Referred by Dr. Horald Pollen for umbilical hernia  This is a 48 year old male who presents with a 3-year history of an enlarging umbilical hernia. This has become more uncomfortable. The patient has fatty liver and also drinks 2-3 drinks/ day at least four days per week. He has developed increasing swelling over the hernia with some tenderness. The patient also reports some epigastric upper abdominal pain after eating associated with some nausea. The patient continues to gain weight and wants to consider bariatric surgery. He has Dm2, hyperlipidemia, hypertension. He is referred for evaluation of his hernia.  CLINICAL DATA: Intestinal malabsorption.  EXAM: ABDOMEN ULTRASOUND COMPLETE  COMPARISON: Body CT 06/15/2014  FINDINGS: Gallbladder: No gallstones or wall thickening visualized. No sonographic Murphy sign noted by sonographer.  Common bile duct: Diameter: 4.8 mm  Liver: No focal lesion identified. Coarse increased parenchymal echogenicity. Portal vein is patent on color Doppler imaging with normal direction of blood flow towards the liver.  IVC: No abnormality visualized.  Pancreas: Limited visualization. Echogenic appearance of the pancreas, where visualized.  Spleen: Size and appearance within normal limits.  Right Kidney: Length: 13.1 cm. Echogenicity within normal limits. No mass or hydronephrosis visualized.  Left Kidney: Length: 12.0 cm. Echogenicity within normal limits. No mass or hydronephrosis visualized.  Abdominal aorta: Limited visualization. No aneurysm visualized.  Other findings: None.  IMPRESSION: Coarse increased parenchymal echogenicity of the liver, which may be seen with hepatic steatosis or fibrosis.  Echogenic pancreas, usually associated with fatty degeneration of the pancreas.   Electronically Signed By:  Fidela Salisbury M.D. On: 05/28/2018 15:05   Past Surgical History Colon Polyp Removal - Colonoscopy   Diagnostic Studies History  Colonoscopy  1-5 years ago  Allergies  No Known Drug Allergies   Medication History  Bystolic (20MG  Tablet, Oral) Active. DULoxetine HCl (60MG  Capsule DR Part, Oral) Active. Finasteride (5MG  Tablet, Oral) Active. hydroCHLOROthiazide (25MG  Tablet, Oral) Active. Rosuvastatin Calcium (20MG  Tablet, Oral) Active. Tadalafil (5MG  Tablet, Oral) Active. Medications Reconciled  Social History  Alcohol use  Heavy alcohol use. Caffeine use  Carbonated beverages, Coffee, Tea. Illicit drug use  Uses socially only. Tobacco use  Never smoker.  Family History  Alcohol Abuse  Brother, Father. Cancer  Mother. Cerebrovascular Accident  Father. Depression  Brother, Father, Mother. Diabetes Mellitus  Father. Hypertension  Father. Ischemic Bowel Disease  Father. Respiratory Condition  Mother.  Other Problems  Alcohol Abuse  Anxiety Disorder  Asthma  Back Pain  Depression  Diabetes Mellitus  Diverticulosis  Enlarged Prostate  Gastroesophageal Reflux Disease  Hemorrhoids  High blood pressure  Hypercholesterolemia  Sleep Apnea  Umbilical Hernia Repair     Review of Systems  General Present- Fatigue and Night Sweats. Not Present- Appetite Loss, Chills, Fever, Weight Gain and Weight Loss. Skin Not Present- Change in Wart/Mole, Dryness, Hives, Jaundice, New Lesions, Non-Healing Wounds, Rash and Ulcer. HEENT Present- Hearing Loss, Hoarseness, Seasonal Allergies and Wears glasses/contact lenses. Not Present- Earache, Nose Bleed, Oral Ulcers, Ringing in the Ears, Sinus Pain, Sore Throat, Visual Disturbances and Yellow Eyes. Respiratory Present- Chronic Cough, Difficulty Breathing and Wheezing. Not Present- Bloody sputum and Snoring. Breast Not Present- Breast Mass, Breast Pain, Nipple Discharge and Skin  Changes. Cardiovascular Present- Leg Cramps and Shortness of Breath. Not Present- Chest Pain, Difficulty Breathing Lying Down, Palpitations, Rapid Heart Rate and Swelling of Extremities. Gastrointestinal Present- Abdominal Pain, Bloating, Chronic diarrhea, Excessive  gas and Hemorrhoids. Not Present- Bloody Stool, Change in Bowel Habits, Constipation, Difficulty Swallowing, Gets full quickly at meals, Indigestion, Nausea, Rectal Pain and Vomiting. Male Genitourinary Not Present- Blood in Urine, Change in Urinary Stream, Frequency, Impotence, Nocturia, Painful Urination, Urgency and Urine Leakage.  Vitals  Weight: 336.8 lb Height: 72in Body Surface Area: 2.66 m Body Mass Index: 45.68 kg/m  Temp.: 97.35F (Temporal)  Pulse: 88 (Regular)  BP: 158/92(Sitting, Left Arm, Standard)       Physical Exam The physical exam findings are as follows: Note: Obese male in NAD Eyes: Pupils equal, round; sclera anicteric HENT: Oral mucosa moist; good dentition Neck: No masses palpated, no thyromegaly Lungs: CTA bilaterally; normal respiratory effort CV: Regular rate and rhythm; no murmurs; extremities well-perfused with no edema Abd: +bowel sounds, obese, soft, non-tender; periumbilical hernia protruding to the right side; partially reducible when supine GU: significant groin adipose tissue - no palpable inguinal hernia on either side Skin: Warm, dry; no sign of jaundice Psychiatric - alert and oriented x 4; calm mood and affect    Assessment & Plan  UMBILICAL HERNIA WITHOUT OBSTRUCTION OR GANGRENE (K42.9)   The surgical procedure has been discussed with the patient.  Potential risks, benefits, alternative treatments, and expected outcomes have been explained.  All of the patient's questions at this time have been answered.  The likelihood of reaching the patient's treatment goal is good.  The patient understand the proposed surgical procedure and wishes to  proceed.  Imogene Burn. Georgette Dover, MD, [facility]  General/ Trauma Surgery   06/01/2019 7:14 AM

## 2019-06-01 NOTE — Discharge Instructions (Signed)
[facility] _______[facility]  UMBILICAL HERNIA REPAIR: POST OP INSTRUCTIONS  Always review your discharge instruction sheet given to you by the facility where your surgery was performed. IF YOU HAVE DISABILITY OR FAMILY LEAVE FORMS, YOU MUST BRING THEM TO THE OFFICE FOR PROCESSING.   DO NOT GIVE THEM TO YOUR DOCTOR.  1. A  prescription for pain medication may be given to you upon discharge.  Take your pain medication as prescribed, if needed.  If narcotic pain medicine is not needed, then you may take acetaminophen (Tylenol) or ibuprofen (Advil) as needed. 2. Take your usually prescribed medications unless otherwise directed. If you need a refill on your pain medication, please contact your pharmacy.  They will contact our office to request authorization. Prescriptions will not be filled after 5 pm or on week-ends. 3. You should follow a light diet the first 24 hours after arrival home, such as soup and crackers, etc.  Be sure to include lots of fluids daily.  Resume your normal diet the day after surgery. 4.Most patients will experience some swelling and bruising around the umbilicus.  Ice packs and reclining will help.  Swelling and bruising can take several days to resolve.  6. It is common to experience some constipation if taking pain medication after surgery.  Increasing fluid intake and taking a stool softener (such as Colace) will usually help or prevent this problem from occurring.  A mild laxative (Milk of Magnesia or Miralax) should be taken according to package directions if there are no bowel movements after 48 hours. 7. Unless discharge instructions indicate otherwise, you may remove your bandages 24-48 hours after surgery, and you may shower at that time.  You may have steri-strips (small skin tapes) in place directly over the incision.  These strips should be left on the skin for 7-10 days.  8. ACTIVITIES:  You may resume regular (light) daily activities beginning the next  day--such as daily self-care, walking, climbing stairs--gradually increasing activities as tolerated.  You may have sexual intercourse when it is comfortable.  Refrain from any heavy lifting or straining until approved by your doctor.  a.You may drive when you are no longer taking prescription pain medication, you can comfortably wear a seatbelt, and you can safely maneuver your car and apply brakes. b.RETURN TO WORK:   _____________________________________________  9.You should see your doctor in the office for a follow-up appointment approximately 2-3 weeks after your surgery.  Make sure that you call for this appointment within a day or two after you arrive home to insure a convenient appointment time. 10.OTHER INSTRUCTIONS: _________________________    _____________________________________  WHEN TO CALL YOUR DOCTOR: 1. Fever over 101.0 2. Inability to urinate 3. Nausea and/or vomiting 4. Extreme swelling or bruising 5. Continued bleeding from incision. 6. Increased pain, redness, or drainage from the incision  The clinic staff is available to answer your questions during regular business hours.  Please dont hesitate to call and ask to speak to one of the nurses for clinical concerns.  If you have a medical emergency, go to the nearest emergency room or call 911.  A surgeon from [facility] is always on call at the hospital   [address] [place], [place], [place]  [address] ?  [address], [place], [place]   [address] [phone] ? [phone] ? [phone] Web site: [email or web address]

## 2019-06-01 NOTE — Anesthesia Procedure Notes (Signed)
Procedure Name: Intubation Date/Time: 06/01/2019 7:47 AM Performed by: Genelle Bal, CRNA Pre-anesthesia Checklist: Patient identified, Emergency Drugs available, Suction available and Patient being monitored Patient Re-evaluated:Patient Re-evaluated prior to induction Oxygen Delivery Method: Circle system utilized Preoxygenation: Pre-oxygenation with 100% oxygen Induction Type: IV induction Ventilation: Mask ventilation without difficulty and Two handed mask ventilation required Laryngoscope Size: Glidescope and 4 Grade View: Grade I Tube type: Oral Tube size: 7.5 mm Number of attempts: 1 Airway Equipment and Method: Stylet and Oral airway Placement Confirmation: ETT inserted through vocal cords under direct vision,  positive ETCO2 and breath sounds checked- equal and bilateral Secured at: 22 cm Tube secured with: Tape Dental Injury: Teeth and Oropharynx as per pre-operative assessment

## 2019-06-01 NOTE — Op Note (Signed)
Indications:  The patient presented with a history of an enlarging supraumbilical hernia.  The patient was examined and we recommended umbilical hernia repair with mesh.  Pre-operative diagnosis:  Umbilical hernia  Post-operative diagnosis:  Same  Procedure:  Umbilical hernia repair with mesh (Medium ventralex)  Procedure Details  The patient was seen again in the Holding Room. The risks, benefits, complications, treatment options, and expected outcomes were discussed with the patient. The possibilities of reaction to medication, pulmonary aspiration, perforation of viscus, bleeding, recurrent infection, the need for additional procedures, and development of a complication requiring transfusion or further operation were discussed with the patient and/or family. There was concurrence with the proposed plan, and informed consent was obtained. The site of surgery was properly noted/marked. The patient was taken to the Operating Room, identified as Samuel Lang, and the procedure verified as umbilical hernia repair. A Time Out was held and the above information confirmed.  After an adequate level of general anesthesia was obtained, the patient's abdomen was prepped with Chloraprep and draped in sterile fashion.  We made a transverse incision above the umbilicus.  Dissection was carried down to the hernia sac with cautery.  The hernia sac protrudes towards the right side.  We dissected bluntly around the hernia sac down to the edge of the fascial defect.  I opened the hernia sac and dissected the incarcerated omentum free.  The hernia sac was excised and we reduced the omentum.  The fascial defect measured 2.5 cm and was located just above the base of the umbilicus.  We cleared the fascia in all directions.  A medium Ventralex mesh was inserted into the pre-peritoneal space and was deployed.  The mesh was secured with four trans-fascial sutures of 0 Novofil.  The fascial defect was closed with multiple  interrupted figure-of-eight 1 Novofil sutures.  3-0 Vicryl was used to close the subcutaneous tissues and 4-0 Monocryl was used to close the skin.  Steri-strips and clean dressing were applied.  The patient was extubated and brought to the recovery room in stable condition.  All sponge, instrument, and needle counts were correct prior to closure and at the conclusion of the case.   Estimated Blood Loss: less than 50 mL          Complications: None; patient tolerated the procedure well.         Disposition: PACU - hemodynamically stable.         Condition: stable  Samuel Lang. Samuel Dover, MD, [facility] Surgery  General/ Trauma Surgery   06/01/2019 8:35 AM

## 2019-06-01 NOTE — Transfer of Care (Signed)
Immediate Anesthesia Transfer of Care Note  Patient: Samuel Lang  Procedure(s) Performed: HERNIA REPAIR UMBILICAL WITH MESH (N/A Abdomen)  Patient Location: PACU  Anesthesia Type:General  Level of Consciousness: awake, alert  and oriented  Airway & Oxygen Therapy: Patient Spontanous Breathing and Patient connected to face mask oxygen  Post-op Assessment: Report given to RN and Post -op Vital signs reviewed and stable  Post vital signs: Reviewed and stable  Last Vitals:  Vitals Value Taken Time  BP 119/67 06/01/19 0849  Temp    Pulse 72 06/01/19 0850  Resp 8 06/01/19 0850  SpO2 100 % 06/01/19 0850  Vitals shown include unvalidated device data.  Last Pain:  Vitals:   06/01/19 0628  TempSrc: Oral  PainSc:          Complications: No apparent anesthesia complications

## 2019-06-01 NOTE — Anesthesia Postprocedure Evaluation (Signed)
Anesthesia Post Note  Patient: Samuel Lang  Procedure(s) Performed: HERNIA REPAIR UMBILICAL WITH MESH (N/A Abdomen) Insertion Of Mesh (N/A Abdomen)     Patient location during evaluation: PACU Anesthesia Type: General Level of consciousness: awake and alert Pain management: pain level controlled Vital Signs Assessment: post-procedure vital signs reviewed and stable Respiratory status: spontaneous breathing, nonlabored ventilation, respiratory function stable and patient connected to nasal cannula oxygen Cardiovascular status: blood pressure returned to baseline and stable Postop Assessment: no apparent nausea or vomiting Anesthetic complications: no    Last Vitals:  Vitals:   06/01/19 1005 06/01/19 1017  BP:  (!) 156/90  Pulse:  70  Resp:  16  Temp: (!) 36.1 C   SpO2:  95%    Last Pain:  Vitals:   06/01/19 1017  TempSrc:   PainSc: 0-No pain                 Giovannina Mun COKER

## 2019-06-01 NOTE — Anesthesia Preprocedure Evaluation (Signed)
Anesthesia Evaluation  Patient identified by MRN, date of birth, ID band Patient awake    Reviewed: NPO status , Patient's Chart, lab work & pertinent test results  Airway Mallampati: III  TM Distance: >3 FB     Dental  (+) Teeth Intact, Dental Advisory Given   Pulmonary    breath sounds clear to auscultation       Cardiovascular hypertension,  Rhythm:Regular Rate:Normal     Neuro/Psych    GI/Hepatic   Endo/Other  diabetes  Renal/GU      Musculoskeletal   Abdominal (+) + obese,   Peds  Hematology   Anesthesia Other Findings   Reproductive/Obstetrics                             Anesthesia Physical Anesthesia Plan  ASA: III  Anesthesia Plan: General   Post-op Pain Management:    Induction: Intravenous  PONV Risk Score and Plan: Ondansetron and Dexamethasone  Airway Management Planned: Oral ETT  Additional Equipment:   Intra-op Plan:   Post-operative Plan: Extubation in OR  Informed Consent: I have reviewed the patients History and Physical, chart, labs and discussed the procedure including the risks, benefits and alternatives for the proposed anesthesia with the patient or authorized representative who has indicated his/her understanding and acceptance.     Dental advisory given  Plan Discussed with: CRNA and Anesthesiologist  Anesthesia Plan Comments:         Anesthesia Quick Evaluation

## 2019-06-02 LAB — SURGICAL PATHOLOGY

## 2019-06-03 ENCOUNTER — Encounter (HOSPITAL_COMMUNITY): Payer: Self-pay | Admitting: Surgery

## 2019-06-04 ENCOUNTER — Encounter (HOSPITAL_COMMUNITY): Payer: Self-pay | Admitting: Surgery

## 2019-06-07 ENCOUNTER — Encounter (HOSPITAL_COMMUNITY): Payer: Self-pay | Admitting: Surgery

## 2019-07-13 ENCOUNTER — Ambulatory Visit: Payer: BC Managed Care – PPO

## 2019-07-20 ENCOUNTER — Ambulatory Visit: Payer: BC Managed Care – PPO

## 2019-07-27 ENCOUNTER — Ambulatory Visit: Payer: BC Managed Care – PPO

## 2019-12-03 IMAGING — NM NUCLEAR MEDICINE HEPATOBILIARY IMAGING WITH GALLBLADDER EF
2 series · 12 of 12 positions shown · non-contrast
Comparison: None.

CLINICAL DATA: Right upper quadrant pain

EXAM:
NUCLEAR MEDICINE HEPATOBILIARY IMAGING WITH GALLBLADDER EF
VIEWS:
Anterior right upper quadrant
RADIOPHARMACEUTICALS:  5.3 mCi Kc-YYm  Choletec IV

[he hepatobiliary · 4.52mm/px · 6 of 60 frames shown (1 of 2)]
[frame 6/60]
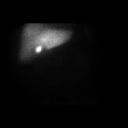
[frame 16/60]
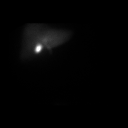
[frame 26/60]
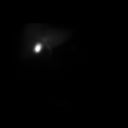
[frame 36/60]
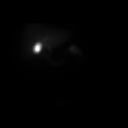
[frame 46/60]
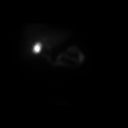
[frame 56/60]
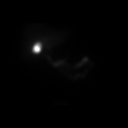

[he hepatobiliary · 4.52mm/px · 6 of 60 frames shown (2 of 2)]
[frame 6/60]
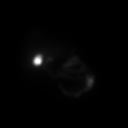
[frame 16/60]
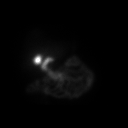
[frame 26/60]
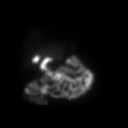
[frame 36/60]
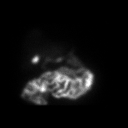
[frame 46/60]
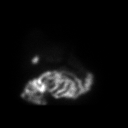
[frame 56/60]
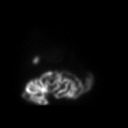

[12 of 12 positions shown; findings below may reference images not displayed]

FINDINGS: Liver uptake of radiotracer is unremarkable. There is prompt
visualization of gallbladder and small bowel, indicating patency of
the cystic and common bile ducts. The patient consumed 8 ounces of
Ensure Enlive with calculation of the computer generated ejection
fraction of radiotracer from the gallbladder. The patient did not
experience clinical symptoms with the oral Ensure consumption. The
computer generated ejection fraction of radiotracer from the
gallbladder is normal at 92%, normal greater than 33% using the oral
agent.
IMPRESSION: Study within normal limits.

## 2020-01-14 ENCOUNTER — Other Ambulatory Visit: Payer: Self-pay | Admitting: Family Medicine

## 2020-01-14 ENCOUNTER — Ambulatory Visit
Admission: RE | Admit: 2020-01-14 | Discharge: 2020-01-14 | Disposition: A | Discharge status: Home / Self Care | Payer: BC Managed Care – PPO | Source: Ambulatory Visit | Attending: Family Medicine | Admitting: Family Medicine

## 2020-01-14 DIAGNOSIS — R059 Cough, unspecified: Secondary | ICD-10-CM

## 2020-05-04 ENCOUNTER — Encounter: Payer: Self-pay | Admitting: Internal Medicine

## 2020-06-23 ENCOUNTER — Other Ambulatory Visit: Payer: Self-pay

## 2020-06-23 ENCOUNTER — Emergency Department (HOSPITAL_COMMUNITY): Payer: BC Managed Care – PPO

## 2020-06-23 ENCOUNTER — Observation Stay (HOSPITAL_COMMUNITY)
Admission: EM | Admit: 2020-06-23 | Discharge: 2020-06-24 | Disposition: A | Discharge status: Home / Self Care | Payer: BC Managed Care – PPO | Attending: Internal Medicine | Admitting: Internal Medicine

## 2020-06-23 ENCOUNTER — Encounter (HOSPITAL_COMMUNITY): Payer: Self-pay

## 2020-06-23 DIAGNOSIS — R2990 Unspecified symptoms and signs involving the nervous system: Secondary | ICD-10-CM

## 2020-06-23 DIAGNOSIS — Z20822 Contact with and (suspected) exposure to covid-19: Secondary | ICD-10-CM | POA: Insufficient documentation

## 2020-06-23 DIAGNOSIS — I10 Essential (primary) hypertension: Secondary | ICD-10-CM | POA: Diagnosis present

## 2020-06-23 DIAGNOSIS — I639 Cerebral infarction, unspecified: Secondary | ICD-10-CM | POA: Diagnosis present

## 2020-06-23 DIAGNOSIS — E876 Hypokalemia: Secondary | ICD-10-CM

## 2020-06-23 DIAGNOSIS — E119 Type 2 diabetes mellitus without complications: Secondary | ICD-10-CM | POA: Insufficient documentation

## 2020-06-23 DIAGNOSIS — R4789 Other speech disturbances: Secondary | ICD-10-CM | POA: Diagnosis present

## 2020-06-23 DIAGNOSIS — I160 Hypertensive urgency: Secondary | ICD-10-CM | POA: Diagnosis not present

## 2020-06-23 DIAGNOSIS — G459 Transient cerebral ischemic attack, unspecified: Principal | ICD-10-CM | POA: Diagnosis present

## 2020-06-23 LAB — COMPREHENSIVE METABOLIC PANEL
ALT: 62 U/L — ABNORMAL HIGH (ref 0–44)
AST: 61 U/L — ABNORMAL HIGH (ref 15–41)
Albumin: 4.2 g/dL (ref 3.5–5.0)
Alkaline Phosphatase: 83 U/L (ref 38–126)
Anion gap: 11 (ref 5–15)
BUN: 6 mg/dL (ref 6–20)
CO2: 25 mmol/L (ref 22–32)
Calcium: 9.8 mg/dL (ref 8.9–10.3)
Chloride: 100 mmol/L (ref 98–111)
Creatinine, Ser: 0.81 mg/dL (ref 0.61–1.24)
GFR, Estimated: >60 mL/min (ref >60)
Glucose, Bld: 219 mg/dL — ABNORMAL HIGH (ref 70–99)
Potassium: 3.5 mmol/L (ref 3.5–5.1)
Sodium: 136 mmol/L (ref 135–145)
Total Bilirubin: 1.3 mg/dL — ABNORMAL HIGH (ref 0.3–1.2)
Total Protein: 8.1 g/dL (ref 6.5–8.1)

## 2020-06-23 LAB — DIFFERENTIAL
Abs Immature Granulocytes: 0.06 10*3/uL (ref 0.00–0.07)
Basophils Absolute: 0.1 10*3/uL (ref 0.0–0.1)
Basophils Relative: 1 %
Eosinophils Absolute: 0.1 10*3/uL (ref 0.0–0.5)
Eosinophils Relative: 1 %
Immature Granulocytes: 1 %
Lymphocytes Relative: 25 %
Lymphs Abs: 2.3 10*3/uL (ref 0.7–4.0)
Monocytes Absolute: 0.6 10*3/uL (ref 0.1–1.0)
Monocytes Relative: 7 %
Neutro Abs: 6.1 10*3/uL (ref 1.7–7.7)
Neutrophils Relative %: 65 %

## 2020-06-23 LAB — URINALYSIS, ROUTINE W REFLEX MICROSCOPIC
Bilirubin Urine: NEGATIVE
Glucose, UA: NEGATIVE mg/dL
Hgb urine dipstick: NEGATIVE
Ketones, ur: NEGATIVE mg/dL
Leukocytes,Ua: NEGATIVE
Nitrite: NEGATIVE
Protein, ur: 30 mg/dL — AB
Specific Gravity, Urine: 1.012 (ref 1.005–1.030)
pH: 7 (ref 5.0–8.0)

## 2020-06-23 LAB — GLUCOSE, CAPILLARY: Glucose-Capillary: 130 mg/dL — ABNORMAL HIGH (ref 70–99)

## 2020-06-23 LAB — FERRITIN: Ferritin: 134 ng/mL (ref 24–336)

## 2020-06-23 LAB — CBC
HCT: 54.2 % — ABNORMAL HIGH (ref 39.0–52.0)
Hemoglobin: 18.2 g/dL — ABNORMAL HIGH (ref 13.0–17.0)
MCH: 30.7 pg (ref 26.0–34.0)
MCHC: 33.6 g/dL (ref 30.0–36.0)
MCV: 91.6 fL (ref 80.0–100.0)
Platelets: 151 10*3/uL (ref 150–400)
RBC: 5.92 MIL/uL — ABNORMAL HIGH (ref 4.22–5.81)
RDW: 13.1 % (ref 11.5–15.5)
WBC: 9.3 10*3/uL (ref 4.0–10.5)
nRBC: 0 % (ref 0.0–0.2)

## 2020-06-23 LAB — I-STAT CHEM 8, ED
BUN: 7 mg/dL (ref 6–20)
Calcium, Ion: 1.14 mmol/L — ABNORMAL LOW (ref 1.15–1.40)
Chloride: 98 mmol/L (ref 98–111)
Creatinine, Ser: 0.6 mg/dL — ABNORMAL LOW (ref 0.61–1.24)
Glucose, Bld: 209 mg/dL — ABNORMAL HIGH (ref 70–99)
HCT: 56 % — ABNORMAL HIGH (ref 39.0–52.0)
Hemoglobin: 19 g/dL — ABNORMAL HIGH (ref 13.0–17.0)
Potassium: 3.4 mmol/L — ABNORMAL LOW (ref 3.5–5.1)
Sodium: 138 mmol/L (ref 135–145)
TCO2: 24 mmol/L (ref 22–32)

## 2020-06-23 LAB — RAPID URINE DRUG SCREEN, HOSP PERFORMED
Amphetamines: NOT DETECTED
Barbiturates: NOT DETECTED
Benzodiazepines: NOT DETECTED
Cocaine: NOT DETECTED
Opiates: NOT DETECTED
Tetrahydrocannabinol: NOT DETECTED

## 2020-06-23 LAB — PROTIME-INR
INR: 1 (ref 0.8–1.2)
Prothrombin Time: 12.7 seconds (ref 11.4–15.2)

## 2020-06-23 LAB — IRON AND TIBC
Iron: 101 ug/dL (ref 45–182)
Saturation Ratios: 23 % (ref 17.9–39.5)
TIBC: 448 ug/dL (ref 250–450)
UIBC: 347 ug/dL

## 2020-06-23 LAB — RESPIRATORY PANEL BY RT PCR (FLU A&B, COVID)
Influenza A by PCR: NEGATIVE
Influenza B by PCR: NEGATIVE
SARS Coronavirus 2 by RT PCR: NEGATIVE

## 2020-06-23 LAB — RETICULOCYTES
Immature Retic Fract: 10.6 % (ref 2.3–15.9)
RBC.: 5.89 MIL/uL — ABNORMAL HIGH (ref 4.22–5.81)
Retic Count, Absolute: 113.7 10*3/uL (ref 19.0–186.0)
Retic Ct Pct: 1.9 % (ref 0.4–3.1)

## 2020-06-23 LAB — APTT: aPTT: 27 seconds (ref 24–36)

## 2020-06-23 LAB — ETHANOL: Alcohol, Ethyl (B): <10 mg/dL (ref <10)

## 2020-06-23 MED ORDER — HYDROCHLOROTHIAZIDE 12.5 MG PO CAPS
12.5000 mg | ORAL_CAPSULE | Freq: Every day | ORAL | Status: DC
  Administered 2020-06-24: 12.5 mg via ORAL
  Filled 2020-06-23: qty 1

## 2020-06-23 MED ORDER — LORAZEPAM 2 MG/ML IJ SOLN
1.0000 mg | Freq: Once | INTRAMUSCULAR | Status: AC
  Administered 2020-06-23: 1 mg via INTRAVENOUS
  Filled 2020-06-23: qty 1

## 2020-06-23 MED ORDER — PANTOPRAZOLE SODIUM 40 MG PO TBEC
40.0000 mg | DELAYED_RELEASE_TABLET | Freq: Every day | ORAL | Status: DC
  Administered 2020-06-24: 40 mg via ORAL
  Filled 2020-06-23: qty 1

## 2020-06-23 MED ORDER — FINASTERIDE 5 MG PO TABS
5.0000 mg | ORAL_TABLET | Freq: Every day | ORAL | Status: DC
  Filled 2020-06-23: qty 1

## 2020-06-23 MED ORDER — HYDRALAZINE HCL 25 MG PO TABS
25.0000 mg | ORAL_TABLET | Freq: Four times a day (QID) | ORAL | Status: DC | PRN
  Administered 2020-06-23: 25 mg via ORAL
  Filled 2020-06-23: qty 1

## 2020-06-23 MED ORDER — ACETAMINOPHEN 325 MG PO TABS: 650.0000 mg | ORAL_TABLET | ORAL | Status: DC | PRN

## 2020-06-23 MED ORDER — GADOBUTROL 1 MMOL/ML IV SOLN
10.0000 mL | Freq: Once | INTRAVENOUS | Status: AC | PRN
  Administered 2020-06-23: 10 mL via INTRAVENOUS

## 2020-06-23 MED ORDER — NEBIVOLOL HCL 20 MG PO TABS: 20.0000 mg | ORAL_TABLET | Freq: Every day | ORAL | Status: DC

## 2020-06-23 MED ORDER — ALBUTEROL SULFATE HFA 108 (90 BASE) MCG/ACT IN AERS
1.0000 | INHALATION_SPRAY | Freq: Four times a day (QID) | RESPIRATORY_TRACT | Status: DC | PRN
  Filled 2020-06-23: qty 6.7

## 2020-06-23 MED ORDER — IBUPROFEN 200 MG PO TABS: 400.0000 mg | ORAL_TABLET | Freq: Four times a day (QID) | ORAL | Status: DC | PRN

## 2020-06-23 MED ORDER — ASPIRIN 81 MG PO CHEW
324.0000 mg | CHEWABLE_TABLET | Freq: Once | ORAL | Status: AC
  Administered 2020-06-23: 324 mg via ORAL
  Filled 2020-06-23: qty 4

## 2020-06-23 MED ORDER — ENOXAPARIN SODIUM 80 MG/0.8ML ~~LOC~~ SOLN
70.0000 mg | SUBCUTANEOUS | Status: DC
  Administered 2020-06-23: 70 mg via SUBCUTANEOUS
  Filled 2020-06-23: qty 0.7
  Filled 2020-06-23: qty 0.8

## 2020-06-23 MED ORDER — METOPROLOL TARTRATE 50 MG PO TABS
100.0000 mg | ORAL_TABLET | Freq: Two times a day (BID) | ORAL | Status: DC
  Administered 2020-06-24: 100 mg via ORAL
  Filled 2020-06-23: qty 2

## 2020-06-23 MED ORDER — VERAPAMIL HCL ER 120 MG PO TBCR
120.0000 mg | EXTENDED_RELEASE_TABLET | Freq: Every morning | ORAL | Status: DC
  Administered 2020-06-24: 120 mg via ORAL
  Filled 2020-06-23 (×2): qty 1

## 2020-06-23 MED ORDER — BUPROPION HCL ER (SR) 100 MG PO TB12
100.0000 mg | ORAL_TABLET | Freq: Every morning | ORAL | Status: DC
  Administered 2020-06-24: 100 mg via ORAL
  Filled 2020-06-23 (×2): qty 1

## 2020-06-23 MED ORDER — ROSUVASTATIN CALCIUM 20 MG PO TABS
20.0000 mg | ORAL_TABLET | Freq: Every day | ORAL | Status: DC
  Administered 2020-06-24: 20 mg via ORAL
  Filled 2020-06-23: qty 1

## 2020-06-23 MED ORDER — MAGNESIUM OXIDE 400 (241.3 MG) MG PO TABS
420.0000 mg | ORAL_TABLET | Freq: Every day | ORAL | Status: DC
  Administered 2020-06-24: 400 mg via ORAL
  Filled 2020-06-23 (×2): qty 1

## 2020-06-23 MED ORDER — INSULIN ASPART 100 UNIT/ML ~~LOC~~ SOLN: 0.0000 [IU] | Freq: Three times a day (TID) | SUBCUTANEOUS | Status: DC

## 2020-06-23 MED ORDER — SILDENAFIL CITRATE 25 MG PO TABS: 25.0000 mg | ORAL_TABLET | Freq: Every day | ORAL | Status: DC | PRN

## 2020-06-23 MED ORDER — SODIUM CHLORIDE 0.9% FLUSH
3.0000 mL | Freq: Once | INTRAVENOUS | Status: AC
Start: 2020-06-23 — End: 2020-06-23
  Administered 2020-06-23: 3 mL via INTRAVENOUS

## 2020-06-23 MED ORDER — LORATADINE 10 MG PO TABS: 10.0000 mg | ORAL_TABLET | Freq: Every day | ORAL | Status: DC | PRN

## 2020-06-23 MED ORDER — TRIAMCINOLONE ACETONIDE 55 MCG/ACT NA AERO
2.0000 | INHALATION_SPRAY | Freq: Every day | NASAL | Status: DC | PRN
  Filled 2020-06-23: qty 21.6

## 2020-06-23 MED ORDER — ACETAMINOPHEN 160 MG/5ML PO SOLN: 650.0000 mg | ORAL | Status: DC | PRN

## 2020-06-23 MED ORDER — STROKE: EARLY STAGES OF RECOVERY BOOK
Freq: Once | Status: AC
  Filled 2020-06-23 (×2): qty 1

## 2020-06-23 MED ORDER — IRBESARTAN 300 MG PO TABS
300.0000 mg | ORAL_TABLET | Freq: Every day | ORAL | Status: DC
  Administered 2020-06-24: 300 mg via ORAL
  Filled 2020-06-23: qty 1

## 2020-06-23 MED ORDER — ACETAMINOPHEN 650 MG RE SUPP: 650.0000 mg | RECTAL | Status: DC | PRN

## 2020-06-23 NOTE — ED Triage Notes (Signed)
Pt arrives to ED w/ c/o confusion and aphasia w/ LKW at 1230 today. On arrival to ED pt passed stroke screen and does not appear to be aphasic and able to answer orientation questions appropriately. NIHSS = 0. Pt states he just feels "weird."

## 2020-06-23 NOTE — ED Notes (Signed)
Patient transported to MRI 

## 2020-06-23 NOTE — Consult Note (Addendum)
Neurology Consultation  Reason for Consult: Speech difficulty Referring Physician: Dr. Nanda Quinton, emergency medicine  CC: Strokelike symptoms of speech disturbance  History is obtained from: Patient, chart  HPI: Samuel Lang is a 49 y.o. male past medical history of hypertension, hyperlipidemia, obstructive sleep apnea, obesity, presenting to the emergency room with last known normal around 11 AM yesterday when he was at work and noticed sudden onset of inability to comprehend tasks noted around 11:30 AM.  He also had significant difficulty with his speech that he describes as word finding difficulty as well as fogginess in his thought. He drove himself to the hospital and his symptoms lasted somewhere around 1 to 2 hours.  At this point he reports that he feels nearly back to normal but has somewhat of not feeling right sensation which she cannot describe clearly. Going back about a week, he says that he has been feeling unwell for a week-feeling lightheaded and dizzy on multiple occasions.  The last few months, he has had episodes where while driving he almost felt like he slept on the wheel for a brief incident.  This has not happened before and these 2 episodes have been relatively recent.  He got to the destination where he was driving to and had no symptoms-1 time while going to his brother's place, but on the way back had similar symptoms. He has sleep apnea and uses a CPAP but is unable to use it consistently for multiple hours as he sometimes falls asleep on the couch. He did not have any tingling numbness or weakness. No visual symptoms. No headaches Has not had stroke in the past No chest pain shortness of breath fevers chills or sick exposures at this time.  Of note-blood pressures have been elevated in the emergency room.  Is on 3 antihypertensive medications at home and says that usually has good control of his blood pressure at home.    LKW: 11 a.m. on 06/23/2020 tpa given?:  no, outside the window, NIH 0 on presentation Premorbid modified Rankin scale (mRS):0 ROS: Performed and is negative except as noted in HPI.  Past Medical History:  Diagnosis Date  . Allergic rhinitis   . Arthritis   . Asthma   . Colon polyps   . Depression   . Diabetes mellitus without complication ([place])   . Diverticulitis   . Enlarged prostate   . Fatty liver   . Hernia, umbilical   . Hyperlipidemia   . Hypertension   . Sciatic nerve pain   . Sleep apnea    wears CPAP at night  . UTI (lower urinary tract infection)     Family History  Problem Relation Age of Onset  . Lung cancer Mother   . Stroke Father   . Diabetes Father   . Allergies Brother   . Colon cancer Neg Hx    Social History:   reports that he has never smoked. He has never used smokeless tobacco. He reports current alcohol use of about 10.0 standard drinks of alcohol per week. He reports current drug use. Drug: Marijuana.  Reports drinking 2-3 drinks of 1-1/2 ounce bourbon every night.  Medications No current facility-administered medications for this encounter.  Current Outpatient Medications:  .  albuterol (VENTOLIN HFA) 108 (90 Base) MCG/ACT inhaler, Inhale 1-2 puffs into the lungs every 6 (six) hours as needed for wheezing or shortness of breath., Disp: , Rfl:  .  buPROPion (WELLBUTRIN SR) 100 MG 12 hr tablet, Take 100 mg by  mouth in the morning., Disp: , Rfl:  .  BYSTOLIC 20 MG TABS, Take 20 mg by mouth daily. , Disp: , Rfl:  .  finasteride (PROSCAR) 5 MG tablet, Take 5 mg by mouth daily., Disp: , Rfl:  .  ibuprofen (ADVIL) 200 MG tablet, Take 400 mg by mouth every 6 (six) hours as needed for headache, mild pain or moderate pain. , Disp: , Rfl:  .  loratadine (CLARITIN) 10 MG tablet, Take 10 mg by mouth daily as needed for allergies or rhinitis., Disp: , Rfl:  .  Magnesium Oxide 420 (252 Mg) MG TABS, Take 420 mg by mouth daily., Disp: , Rfl:  .  metFORMIN (GLUCOPHAGE-XR) 500 MG 24 hr tablet, Take 500  mg by mouth daily with breakfast. , Disp: , Rfl:  .  olmesartan-hydrochlorothiazide (BENICAR HCT) 40-12.5 MG tablet, Take 1 tablet by mouth daily. , Disp: , Rfl:  .  omeprazole (PRILOSEC) 40 MG capsule, Take 40 mg by mouth daily before breakfast., Disp: , Rfl:  .  rosuvastatin (CRESTOR) 20 MG tablet, Take 1 tablet (20 mg total) by mouth daily. Needs annual appointment for further refills (Patient taking differently: Take 20 mg by mouth daily. ), Disp: 90 tablet, Rfl: 0 .  sildenafil (VIAGRA) 25 MG tablet, Take 25-50 mg by mouth daily as needed for erectile dysfunction., Disp: , Rfl:  .  triamcinolone (NASACORT ALLERGY 24HR) 55 MCG/ACT AERO nasal inhaler, Place 2 sprays into the nose daily as needed (allergies). , Disp: , Rfl:  .  verapamil (CALAN-SR) 120 MG CR tablet, Take 120 mg by mouth in the morning., Disp: , Rfl:  .  anastrozole (ARIMIDEX) 1 MG tablet, Take 0.25 mg by mouth daily., Disp: , Rfl:  .  DULoxetine (CYMBALTA) 60 MG capsule, Take 1 capsule (60 mg total) by mouth daily. (Patient not taking: Reported on 06/23/2020), Disp: 90 capsule, Rfl: 1 .  oxyCODONE (OXY IR/ROXICODONE) 5 MG immediate release tablet, Take 1 tablet (5 mg total) by mouth every 6 (six) hours as needed for severe pain. (Patient not taking: Reported on 06/23/2020), Disp: 20 tablet, Rfl: 0  Exam: Current vital signs: BP (!) 153/73   Pulse 66   Temp 98.3 F (36.8 C) (Oral)   Resp (!) 21   Ht 6' (1.829 m)   Wt (!) 142.4 kg   SpO2 96%   BMI 42.59 kg/m  Vital signs in last 24 hours: Temp:  [98.3 F (36.8 C)-98.6 F (37 C)] 98.3 F (36.8 C) (11/19 1530) Pulse Rate:  [57-66] 66 (11/19 2000) Resp:  [13-23] 21 (11/19 2000) BP: (153-192)/(73-99) 153/73 (11/19 2000) SpO2:  [91 %-97 %] 96 % (11/19 2000) Weight:  [142.4 kg] 142.4 kg (11/19 1420)  GENERAL: Awake, alert in NAD HEENT: - Normocephalic and atraumatic, dry mm, no LN++, no Thyromegally LUNGS - Clear to auscultation bilaterally with no wheezes CV - S1S2  RRR, no m/r/g, equal pulses bilaterally. ABDOMEN - Soft, nontender, nondistended with normoactive BS Ext: warm, well perfused, intact peripheral pulses, no edema  NEURO:  Mental Status: AA&Ox3  Language: speech is nondysarthric.  Naming, repetition, fluency, and comprehension intact. Cranial Nerves: PERRL. EOMI, visual fields full, no facial asymmetry, facial sensation intact, hearing intact, tongue/uvula/soft palate midline, normal sternocleidomastoid and trapezius muscle strength. No evidence of tongue atrophy or fibrillations Motor: 5/5 without drift in all fours Tone: is normal and bulk is normal Sensation- Intact to light touch bilaterally with no extinction Coordination: FTN intact bilaterally Gait- deferred  NIHSS-0  Labs I have reviewed labs in epic and the results pertinent to this consultation are:  CBC    Component Value Date/Time   WBC 9.3 06/23/2020 1439   RBC 5.92 (H) 06/23/2020 1439   HGB 19.0 (H) 06/23/2020 1447   HGB 17.8 (H) 02/11/2019 1138   HCT 56.0 (H) 06/23/2020 1447   PLT 151 06/23/2020 1439   PLT 136 (L) 02/11/2019 1138   MCV 91.6 06/23/2020 1439   MCH 30.7 06/23/2020 1439   MCHC 33.6 06/23/2020 1439   RDW 13.1 06/23/2020 1439   LYMPHSABS 2.3 06/23/2020 1439   MONOABS 0.6 06/23/2020 1439   EOSABS 0.1 06/23/2020 1439   BASOSABS 0.1 06/23/2020 1439    CMP     Component Value Date/Time   NA 138 06/23/2020 1447   K 3.4 (L) 06/23/2020 1447   CL 98 06/23/2020 1447   CO2 25 06/23/2020 1439   GLUCOSE 209 (H) 06/23/2020 1447   BUN 7 06/23/2020 1447   CREATININE 0.60 (L) 06/23/2020 1447   CREATININE 0.73 02/11/2019 1138   CALCIUM 9.8 06/23/2020 1439   PROT 8.1 06/23/2020 1439   ALBUMIN 4.2 06/23/2020 1439   AST 61 (H) 06/23/2020 1439   AST 76 (H) 02/11/2019 1138   ALT 62 (H) 06/23/2020 1439   ALT 90 (H) 02/11/2019 1138   ALKPHOS 83 06/23/2020 1439   BILITOT 1.3 (H) 06/23/2020 1439   BILITOT 0.6 02/11/2019 1138   GFRNONAA >60 06/23/2020 1439    GFRNONAA >60 02/11/2019 1138   GFRAA >60 05/26/2019 1145   GFRAA >60 02/11/2019 1138    Lipid Panel     Component Value Date/Time   CHOL 264 (H) 12/05/2016 0852   TRIG 132.0 12/05/2016 0852   HDL 51.00 12/05/2016 0852   CHOLHDL 5 12/05/2016 0852   VLDL 26.4 12/05/2016 0852   LDLCALC 186 (H) 12/05/2016 0852   LDLDIRECT 182.0 04/06/2015 0905     Imaging I have reviewed the images obtained:  CT-scan of the brain-no acute changes  MRI examination of the brain-no acute infarction hemorrhage or mass. MRI head and neck no hemodynamically significant stenosis in the neck.  No significant stenosis in the head.  No intracranial atherosclerosis.  No aneurysms.  Assessment: 49 year old man with sudden onset of confusion and word finding difficulty that lasted 1 to 2 hours this morning and has resolved. Given his risk factors, TIA is in top contention for differentials. Given elevated blood pressures on presentation, hypertensive urgency should also be considered. Would benefit from stroke/TIA risk factor work-up  Also noted some episodes of feeling like he is sleeping on the wheel-might benefit from repeat outpatient sleep study for sleep apnea.  Impression: Evaluate for TIA Evaluate for hypertensive urgency  Recommendations: Admit to medicine Frequent neurochecks Blood pressure goal-given TIA being the top differential, would allow permissive hypertension but with hypertensive urgency also being in the differentials would The upper limit to systolic 992.  Treat only if systolic blood pressures greater than 180 on a as needed basis. Gradually decrease the blood pressure to normal over the next few days. Aspirin 325 Atorvastatin 80 2D echo A1c Lipid panel PT Okay Bedside swallow screen Stroke neurology will follow with you. Plan discussed with the patient at bedside.  Preliminary recommendations were discussed with the primary service: The consult by the on-call  neurologist.   -- Amie Portland, MD [facility] Pager: [phone] If 7pm to 7am, please call on call as listed on AMION.

## 2020-06-23 NOTE — ED Notes (Signed)
Pt admitted to 3W14; report called to Chrissie Noa, [place].

## 2020-06-23 NOTE — ED Provider Notes (Signed)
Emergency Department Provider Note   I have reviewed the triage vital signs and the nursing notes.   HISTORY  Chief Complaint Stroke Symptoms   HPI Samuel Lang is a 49 y.o. male with past medical history reviewed below presents to the emergency department for evaluation of speech difficulty starting at around 11:30 PM today.  Patient states that he was at work and doing a job which he does daily and was struggling with some of the parts of his duties.  He states that he called his ex-wife on the phone and had significant difficulty speaking and finding the words he was trying to use.  He states he actually had to hang up and call her back due to such trouble.  He drove himself to the hospital and called his PCP but ultimately walked in.  He continued to have some speech difficulty shortly after arrival to the emergency department but states that his speech is currently "99% better."  He does feel some slight processing type issues at times but never had any unilateral weakness or numbness.  No swallowing difficulty or vision changes.  No radiation of symptoms or other modifying factors.   Past Medical History:  Diagnosis Date  . Allergic rhinitis   . Arthritis   . Asthma   . Colon polyps   . Depression   . Diabetes mellitus without complication ([place])   . Diverticulitis   . Enlarged prostate   . Fatty liver   . Hernia, umbilical   . Hyperlipidemia   . Hypertension   . Sciatic nerve pain   . Sleep apnea    wears CPAP at night  . UTI (lower urinary tract infection)     Patient Active Problem List   Diagnosis Date Noted  . Routine general medical examination at a health care facility 12/05/2016  . Hyperlipidemia 01/29/2016  . Essential hypertension 01/29/2016  . Adjustment disorder with depressed mood 01/29/2016  . Arthritis 04/06/2015  . Morbid obesity ([place]) 04/06/2015  . Allergic rhinitis due to pollen 10/10/2010  . Asthma, mild intermittent, well-controlled 04/01/2008     Past Surgical History:  Procedure Laterality Date  . INSERTION OF MESH N/A 06/01/2019   Procedure: Insertion Of Mesh;  Surgeon: Donnie Mesa, MD;  Location: [facility];  Service: General;  Laterality: N/A;  . traumatic pneumothorax sledding accident,rib rx, left pneumothorax, chest tube    . UMBILICAL HERNIA REPAIR N/A 06/01/2019   Procedure: HERNIA REPAIR UMBILICAL WITH MESH;  Surgeon: Donnie Mesa, MD;  Location: [facility];  Service: General;  Laterality: N/A;    Allergies Amlodipine  Family History  Problem Relation Age of Onset  . Lung cancer Mother   . Stroke Father   . Diabetes Father   . Allergies Brother   . Colon cancer Neg Hx     Social History Social History   Tobacco Use  . Smoking status: Never Smoker  . Smokeless tobacco: Never Used  Vaping Use  . Vaping Use: Never used  Substance Use Topics  . Alcohol use: Yes    Alcohol/week: 10.0 standard drinks    Types: 10 Standard drinks or equivalent per week    Comment: over the weekends  . Drug use: Yes    Types: Marijuana    Review of Systems  Constitutional: No fever/chills Eyes: No visual changes. ENT: No sore throat. Cardiovascular: Denies chest pain. Respiratory: Denies shortness of breath. Gastrointestinal: No abdominal pain.  No nausea, no vomiting.  No diarrhea.  No constipation. Genitourinary:  Negative for dysuria. Musculoskeletal: Negative for back pain. Skin: Negative for rash. Neurological: Negative for headaches, focal weakness or numbness. Positive speech disturbance.   10-point ROS otherwise negative.  ____________________________________________   PHYSICAL EXAM:  VITAL SIGNS: ED Triage Vitals  Enc Vitals Group     BP 06/23/20 1420 (!) 192/94     Pulse Rate 06/23/20 1420 61     Resp 06/23/20 1420 18     Temp 06/23/20 1420 98.6 F (37 C)     Temp Source 06/23/20 1420 Oral     SpO2 06/23/20 1420 95 %     Weight 06/23/20 1420 (!) 314 lb (142.4 kg)     Height 06/23/20 1420 6'  (1.829 m)   Constitutional: Alert and oriented. Well appearing and in no acute distress. Eyes: Conjunctivae are normal. EOMI. PERRL.  Head: Atraumatic. Nose: No congestion/rhinnorhea. Mouth/Throat: Mucous membranes are moist. Neck: No stridor.   Cardiovascular: Normal rate, regular rhythm. Good peripheral circulation. Grossly normal heart sounds.   Respiratory: Normal respiratory effort.  No retractions. Lungs CTAB. Gastrointestinal: Soft and nontender. No distention.  Musculoskeletal: No lower extremity tenderness nor edema. No gross deformities of extremities. Neurologic: Speech is mostly fluent but does occasionally hesitate for example when asked to say "no if's, and's, or but's." No gross focal neurologic deficits are appreciated.  No facial asymmetry or other cranial nerve deficits.  5/5 strength in the bilateral upper and lower extremities with normal sensation. Skin:  Skin is warm, dry and intact. No rash noted.  ____________________________________________   LABS (all labs ordered are listed, but only abnormal results are displayed)  Labs Reviewed  CBC - Abnormal; Notable for the following components:      Result Value   RBC 5.92 (*)    Hemoglobin 18.2 (*)    HCT 54.2 (*)    All other components within normal limits  COMPREHENSIVE METABOLIC PANEL - Abnormal; Notable for the following components:   Glucose, Bld 219 (*)    AST 61 (*)    ALT 62 (*)    Total Bilirubin 1.3 (*)    All other components within normal limits  I-STAT CHEM 8, ED - Abnormal; Notable for the following components:   Potassium 3.4 (*)    Creatinine, Ser 0.60 (*)    Glucose, Bld 209 (*)    Calcium, Ion 1.14 (*)    Hemoglobin 19.0 (*)    HCT 56.0 (*)    All other components within normal limits  RESPIRATORY PANEL BY RT PCR (FLU A&B, COVID)  PROTIME-INR  APTT  DIFFERENTIAL  ETHANOL  RAPID URINE DRUG SCREEN, HOSP PERFORMED  URINALYSIS, ROUTINE W REFLEX MICROSCOPIC  CBG MONITORING, ED    ____________________________________________  EKG   EKG Interpretation  Date/Time:  Friday June 23 2020 14:16:06 EST Ventricular Rate:  63 PR Interval:  194 QRS Duration: 110 QT Interval:  410 QTC Calculation: 419 R Axis:   14 Text Interpretation: Normal sinus rhythm Normal ECG No STEMI Confirmed by Nanda Quinton [phone]) on 06/23/2020 5:56:52 PM       ____________________________________________  RADIOLOGY  CT HEAD WO CONTRAST  Result Date: 06/23/2020 CLINICAL DATA:  Neuro deficit, acute stroke suspected. Altered mental status, confusion, aphasia. EXAM: CT HEAD WITHOUT CONTRAST TECHNIQUE: Contiguous axial images were obtained from the base of the skull through the vertex without intravenous contrast. COMPARISON:  MRI 04/29/2013. FINDINGS: Brain: No evidence of acute large vascular territory infarction, hemorrhage, hydrocephalus, extra-axial collection or mass lesion/mass effect. Patchy white matter hypoattenuation, most  commonly related to chronic microvascular ischemic disease. Vascular: No hyperdense vessel or unexpected calcification. Skull: No acute fracture. Sinuses/Orbits: No acute finding. Other: No mastoid effusions. IMPRESSION: 1. No evidence of acute intracranial abnormality. 2. Patchy white matter hypoattenuation, most commonly related to chronic microvascular ischemic disease. Electronically Signed   By: Margaretha Sheffield MD   On: 06/23/2020 16:45    ____________________________________________   PROCEDURES  Procedure(s) performed:   Procedures  None  ____________________________________________   INITIAL IMPRESSION / ASSESSMENT AND PLAN / ED COURSE  Pertinent labs & imaging results that were available during my care of the patient were reviewed by me and considered in my medical decision making (see chart for details).   Patient presents emergency department with speech disturbance at around lunchtime today.  He is outside of the tPA window on arrival  to the emergency department and was not activated as a code stroke.  He has VAN negative.  Very minimal speech disturbance appreciated on my exam.  No other focal neurologic deficits. ABCD2 score 5.  Discussed the case with Dr. Cheral Marker who advises MRI/MRA and admission for CVA/TIA.  Discussed patient's case with [facility] to request admission. Patient and family (if present) updated with plan. Care transferred to [facility] service.  I reviewed all nursing notes, vitals, pertinent old records, EKGs, labs, imaging (as available).  ____________________________________________  FINAL CLINICAL IMPRESSION(S) / ED DIAGNOSES  Final diagnoses:  Stroke-like symptoms    MEDICATIONS GIVEN DURING THIS VISIT:  Medications  sodium chloride flush (NS) 0.9 % injection 3 mL (has no administration in time range)  aspirin chewable tablet 324 mg (has no administration in time range)  LORazepam (ATIVAN) injection 1 mg (1 mg Intravenous Given 06/23/20 1803)    Note:  This document was prepared using Dragon voice recognition software and may include unintentional dictation errors.  Nanda Quinton, MD, [facility] Emergency Medicine    Lisha Vitale, [facility], MD 06/23/20 [phone]

## 2020-06-23 NOTE — H&P (Signed)
History and Physical    [name] QQI:[id number] DOB: 1970-12-03 DOA: 06/23/2020  PCP: Hayden Rasmussen, MD (Confirm with patient/family/NH records and if not entered, this has to be entered at [facility] point of entry) Patient coming from: Home  I have personally briefly reviewed patient's old medical records in [place]  Chief Complaint: Trouble speaking  HPI: Samuel Lang is a 49 y.o. male with medical history significant of IIDM, refractory hypertension, BPH, morbid obesity, sleep apnea on CPAP at bedtime, Gynecomastia on hormone manipulation, presented with sudden onset of confusion and trouble speaking.-This morning after lunch, of sudden patient became very confused and " really having trouble to find words".  Symptoms lasted about 1 hour to 1 hour and half.  At the time I saw him he reported that his speech has recovered 95%.  Denied any headache blurry vision no numbness were weakness of 1 in the limbs. ED Course: MRI and MRA done, no acute stroke and no major vessel stenosis.  Blood pressure significantly elevated systolic 941D to 408X.  Review of Systems: As per HPI otherwise 14 point review of systems negative.    Past Medical History:  Diagnosis Date  . Allergic rhinitis   . Arthritis   . Asthma   . Colon polyps   . Depression   . Diabetes mellitus without complication ([place])   . Diverticulitis   . Enlarged prostate   . Fatty liver   . Hernia, umbilical   . Hyperlipidemia   . Hypertension   . Sciatic nerve pain   . Sleep apnea    wears CPAP at night  . UTI (lower urinary tract infection)     Past Surgical History:  Procedure Laterality Date  . INSERTION OF MESH N/A 06/01/2019   Procedure: Insertion Of Mesh;  Surgeon: Donnie Mesa, MD;  Location: [place];  Service: General;  Laterality: N/A;  . traumatic pneumothorax sledding accident,rib rx, left pneumothorax, chest tube    . UMBILICAL HERNIA REPAIR N/A 06/01/2019   Procedure: HERNIA REPAIR UMBILICAL  WITH MESH;  Surgeon: Donnie Mesa, MD;  Location: [place];  Service: General;  Laterality: N/A;     reports that he has never smoked. He has never used smokeless tobacco. He reports current alcohol use of about 10.0 standard drinks of alcohol per week. He reports current drug use. Drug: Marijuana.  Allergies  Allergen Reactions  . Amlodipine Swelling and Other (See Comments)    Made the feet swell    Family History  Problem Relation Age of Onset  . Lung cancer Mother   . Stroke Father   . Diabetes Father   . Allergies Brother   . Colon cancer Neg Hx      Prior to Admission medications   Medication Sig Start Date End Date Taking? Authorizing Provider  albuterol (VENTOLIN HFA) 108 (90 Base) MCG/ACT inhaler Inhale 1-2 puffs into the lungs every 6 (six) hours as needed for wheezing or shortness of breath.   Yes [provider]  buPROPion (WELLBUTRIN SR) 100 MG 12 hr tablet Take 100 mg by mouth in the morning. 04/26/20  Yes [provider]  BYSTOLIC 20 MG TABS Take 20 mg by mouth daily.  12/18/18  Yes [provider]  finasteride (PROSCAR) 5 MG tablet Take 5 mg by mouth daily.   Yes [provider]  ibuprofen (ADVIL) 200 MG tablet Take 400 mg by mouth every 6 (six) hours as needed for headache, mild pain or moderate pain.  Yes [provider]  loratadine (CLARITIN) 10 MG tablet Take 10 mg by mouth daily as needed for allergies or rhinitis.   Yes [provider]  Magnesium Oxide 420 (252 Mg) MG TABS Take 420 mg by mouth daily.   Yes [provider]  metFORMIN (GLUCOPHAGE-XR) 500 MG 24 hr tablet Take 500 mg by mouth daily with breakfast.  04/26/20  Yes [provider]  olmesartan-hydrochlorothiazide (BENICAR HCT) 40-12.5 MG tablet Take 1 tablet by mouth daily.  02/09/19  Yes [provider]  omeprazole (PRILOSEC) 40 MG capsule Take 40 mg by mouth daily before breakfast.   Yes [provider]  rosuvastatin  (CRESTOR) 20 MG tablet Take 1 tablet (20 mg total) by mouth daily. Needs annual appointment for further refills Patient taking differently: Take 20 mg by mouth daily.  02/16/18  Yes Hoyt Koch, MD  sildenafil (VIAGRA) 25 MG tablet Take 25-50 mg by mouth daily as needed for erectile dysfunction.   Yes [provider]  triamcinolone (NASACORT ALLERGY 24HR) 55 MCG/ACT AERO nasal inhaler Place 2 sprays into the nose daily as needed (allergies).    Yes [provider]  verapamil (CALAN-SR) 120 MG CR tablet Take 120 mg by mouth in the morning. 04/04/20  Yes [provider]  anastrozole (ARIMIDEX) 1 MG tablet Take 0.25 mg by mouth daily. 02/01/20   [provider]  DULoxetine (CYMBALTA) 60 MG capsule Take 1 capsule (60 mg total) by mouth daily. Patient not taking: Reported on 06/23/2020 03/10/17   Janith Lima, MD  oxyCODONE (OXY IR/ROXICODONE) 5 MG immediate release tablet Take 1 tablet (5 mg total) by mouth every 6 (six) hours as needed for severe pain. Patient not taking: Reported on 06/23/2020 06/01/19   Donnie Mesa, MD    Physical Exam: Vitals:   06/23/20 1747 06/23/20 1904 06/23/20 1905 06/23/20 2000  BP:  (!) 183/99  (!) 153/73  Pulse:  65  66  Resp:  16 19 (!) 21  Temp:      TempSrc:      SpO2: 96% 95%  96%  Weight:      Height:        Constitutional: NAD, calm, comfortable Vitals:   06/23/20 1747 06/23/20 1904 06/23/20 1905 06/23/20 2000  BP:  (!) 183/99  (!) 153/73  Pulse:  65  66  Resp:  16 19 (!) 21  Temp:      TempSrc:      SpO2: 96% 95%  96%  Weight:      Height:       Eyes: PERRL, lids and conjunctivae normal ENMT: Mucous membranes are moist. Posterior pharynx clear of any exudate or lesions.Normal dentition.  Neck: normal, supple, no masses, no thyromegaly Respiratory: clear to auscultation bilaterally, no wheezing, no crackles. Normal respiratory effort. No accessory muscle use.  Cardiovascular: Regular rate and  rhythm, no murmurs / rubs / gallops. No extremity edema. 2+ pedal pulses. No carotid bruits.  Abdomen: no tenderness, no masses palpated. No hepatosplenomegaly. Bowel sounds positive.  Musculoskeletal: no clubbing / cyanosis. No joint deformity upper and lower extremities. Good ROM, no contractures. Normal muscle tone.  Skin: no rashes, lesions, ulcers. No induration Neurologic: CN 2-12 grossly intact. Sensation intact, DTR normal. Strength 5/5 in all 4.  Psychiatric: Normal judgment and insight. Alert and oriented x 3. Normal mood.     Labs on Admission: I have personally reviewed following labs and imaging studies  CBC: Recent Labs  Lab 06/23/20 1439  06/23/20 1447  WBC 9.3  --   NEUTROABS 6.1  --   HGB 18.2* 19.0*  HCT 54.2* 56.0*  MCV 91.6  --   PLT 151  --    Basic Metabolic Panel: Recent Labs  Lab 06/23/20 1439 06/23/20 1447  NA 136 138  K 3.5 3.4*  CL 100 98  CO2 25  --   GLUCOSE 219* 209*  BUN 6 7  CREATININE 0.81 0.60*  CALCIUM 9.8  --    GFR: Estimated Creatinine Clearance: 161.7 mL/min (A) (by C-G formula based on SCr of 0.6 mg/dL (L)). Liver Function Tests: Recent Labs  Lab 06/23/20 1439  AST 61*  ALT 62*  ALKPHOS 83  BILITOT 1.3*  PROT 8.1  ALBUMIN 4.2   No results for input(s): LIPASE, AMYLASE in the last 168 hours. No results for input(s): AMMONIA in the last 168 hours. Coagulation Profile: Recent Labs  Lab 06/23/20 1439  INR 1.0   Cardiac Enzymes: No results for input(s): CKTOTAL, CKMB, CKMBINDEX, TROPONINI in the last 168 hours. BNP (last 3 results) No results for input(s): PROBNP in the last 8760 hours. HbA1C: No results for input(s): HGBA1C in the last 72 hours. CBG: No results for input(s): GLUCAP in the last 168 hours. Lipid Profile: No results for input(s): CHOL, HDL, LDLCALC, TRIG, CHOLHDL, LDLDIRECT in the last 72 hours. Thyroid Function Tests: No results for input(s): TSH, T4TOTAL, FREET4, T3FREE, THYROIDAB in the last 72  hours. Anemia Panel: No results for input(s): VITAMINB12, FOLATE, FERRITIN, TIBC, IRON, RETICCTPCT in the last 72 hours. Urine analysis:    Component Value Date/Time   COLORURINE YELLOW 12/09/2012 1805   APPEARANCEUR CLEAR 12/09/2012 1805   LABSPEC 1.018 12/09/2012 1805   PHURINE 7.0 12/09/2012 1805   GLUCOSEU NEGATIVE 12/09/2012 1805   HGBUR NEGATIVE 12/09/2012 1805   BILIRUBINUR NEGATIVE 12/09/2012 1805   KETONESUR NEGATIVE 12/09/2012 1805   PROTEINUR NEGATIVE 12/09/2012 1805   UROBILINOGEN 1.0 12/09/2012 1805   NITRITE NEGATIVE 12/09/2012 1805   LEUKOCYTESUR TRACE (A) 12/09/2012 1805    Radiological Exams on Admission: CT HEAD WO CONTRAST  Result Date: 06/23/2020 CLINICAL DATA:  Neuro deficit, acute stroke suspected. Altered mental status, confusion, aphasia. EXAM: CT HEAD WITHOUT CONTRAST TECHNIQUE: Contiguous axial images were obtained from the base of the skull through the vertex without intravenous contrast. COMPARISON:  MRI 04/29/2013. FINDINGS: Brain: No evidence of acute large vascular territory infarction, hemorrhage, hydrocephalus, extra-axial collection or mass lesion/mass effect. Patchy white matter hypoattenuation, most commonly related to chronic microvascular ischemic disease. Vascular: No hyperdense vessel or unexpected calcification. Skull: No acute fracture. Sinuses/Orbits: No acute finding. Other: No mastoid effusions. IMPRESSION: 1. No evidence of acute intracranial abnormality. 2. Patchy white matter hypoattenuation, most commonly related to chronic microvascular ischemic disease. Electronically Signed   By: Margaretha Sheffield MD   On: 06/23/2020 16:45   MR ANGIO HEAD WO CONTRAST  Result Date: 06/23/2020 CLINICAL DATA:  TIA EXAM: MRI HEAD WITHOUT CONTRAST MRA HEAD WITHOUT CONTRAST MRA NECK WITHOUT AND WITH CONTRAST TECHNIQUE: Multiplanar, multiecho pulse sequences of the brain and surrounding structures were obtained without intravenous contrast. Angiographic images  of the Circle of Willis were obtained using MRA technique without intravenous contrast. Angiographic images of the neck were obtained using MRA technique without and with intravenous contrast. Carotid stenosis measurements (when applicable) are obtained utilizing NASCET criteria, using the distal internal carotid diameter as the denominator. CONTRAST:  6mL GADAVIST GADOBUTROL 1 MMOL/ML IV SOLN COMPARISON:  MRI 2014 FINDINGS: MRI HEAD  Brain: There is no acute infarction or intracranial hemorrhage. There is no intracranial mass, mass effect, or edema. There is no hydrocephalus or extra-axial fluid collection. Ventricles and sulci are normal in size and configuration. Patchy foci of T2 hyperintensity in the supratentorial white matter are nonspecific but may reflect mild chronic microvascular ischemic changes. Vascular: Major vessel flow voids at the skull base are preserved. Skull and upper cervical spine: Normal marrow signal is preserved. Sinuses/Orbits: Paranasal sinuses are aerated. Orbits are unremarkable. Other: Incidental note is made of a partially empty without expansion as seen previously. Mastoid air cells are clear. MRA HEAD Intracranial internal carotid arteries are patent. Middle and anterior cerebral arteries are patent. Intracranial vertebral arteries, basilar artery, posterior cerebral arteries are patent. Bilateral posterior communicating arteries are present. There is no significant stenosis or aneurysm. MRA NECK Common, internal, and external carotid arteries are patent. Extracranial vertebral arteries are patent and codominant. There is no measurable stenosis or evidence of dissection. IMPRESSION: No acute infarction, hemorrhage, or mass. Mild chronic microvascular ischemic changes. No hemodynamically significant stenosis in the neck. No proximal intracranial vessel occlusion or significant stenosis. Electronically Signed   By: Macy Mis M.D.   On: 06/23/2020 19:15   MR Angiogram Neck W or  Wo Contrast  Result Date: 06/23/2020 CLINICAL DATA:  TIA EXAM: MRI HEAD WITHOUT CONTRAST MRA HEAD WITHOUT CONTRAST MRA NECK WITHOUT AND WITH CONTRAST TECHNIQUE: Multiplanar, multiecho pulse sequences of the brain and surrounding structures were obtained without intravenous contrast. Angiographic images of the Circle of Willis were obtained using MRA technique without intravenous contrast. Angiographic images of the neck were obtained using MRA technique without and with intravenous contrast. Carotid stenosis measurements (when applicable) are obtained utilizing NASCET criteria, using the distal internal carotid diameter as the denominator. CONTRAST:  44mL GADAVIST GADOBUTROL 1 MMOL/ML IV SOLN COMPARISON:  MRI 2014 FINDINGS: MRI HEAD Brain: There is no acute infarction or intracranial hemorrhage. There is no intracranial mass, mass effect, or edema. There is no hydrocephalus or extra-axial fluid collection. Ventricles and sulci are normal in size and configuration. Patchy foci of T2 hyperintensity in the supratentorial white matter are nonspecific but may reflect mild chronic microvascular ischemic changes. Vascular: Major vessel flow voids at the skull base are preserved. Skull and upper cervical spine: Normal marrow signal is preserved. Sinuses/Orbits: Paranasal sinuses are aerated. Orbits are unremarkable. Other: Incidental note is made of a partially empty without expansion as seen previously. Mastoid air cells are clear. MRA HEAD Intracranial internal carotid arteries are patent. Middle and anterior cerebral arteries are patent. Intracranial vertebral arteries, basilar artery, posterior cerebral arteries are patent. Bilateral posterior communicating arteries are present. There is no significant stenosis or aneurysm. MRA NECK Common, internal, and external carotid arteries are patent. Extracranial vertebral arteries are patent and codominant. There is no measurable stenosis or evidence of dissection.  IMPRESSION: No acute infarction, hemorrhage, or mass. Mild chronic microvascular ischemic changes. No hemodynamically significant stenosis in the neck. No proximal intracranial vessel occlusion or significant stenosis. Electronically Signed   By: Macy Mis M.D.   On: 06/23/2020 19:15   MR BRAIN WO CONTRAST  Result Date: 06/23/2020 CLINICAL DATA:  TIA EXAM: MRI HEAD WITHOUT CONTRAST MRA HEAD WITHOUT CONTRAST MRA NECK WITHOUT AND WITH CONTRAST TECHNIQUE: Multiplanar, multiecho pulse sequences of the brain and surrounding structures were obtained without intravenous contrast. Angiographic images of the Circle of Willis were obtained using MRA technique without intravenous contrast. Angiographic images of the neck were obtained using MRA  technique without and with intravenous contrast. Carotid stenosis measurements (when applicable) are obtained utilizing NASCET criteria, using the distal internal carotid diameter as the denominator. CONTRAST:  14mL GADAVIST GADOBUTROL 1 MMOL/ML IV SOLN COMPARISON:  MRI 2014 FINDINGS: MRI HEAD Brain: There is no acute infarction or intracranial hemorrhage. There is no intracranial mass, mass effect, or edema. There is no hydrocephalus or extra-axial fluid collection. Ventricles and sulci are normal in size and configuration. Patchy foci of T2 hyperintensity in the supratentorial white matter are nonspecific but may reflect mild chronic microvascular ischemic changes. Vascular: Major vessel flow voids at the skull base are preserved. Skull and upper cervical spine: Normal marrow signal is preserved. Sinuses/Orbits: Paranasal sinuses are aerated. Orbits are unremarkable. Other: Incidental note is made of a partially empty without expansion as seen previously. Mastoid air cells are clear. MRA HEAD Intracranial internal carotid arteries are patent. Middle and anterior cerebral arteries are patent. Intracranial vertebral arteries, basilar artery, posterior cerebral arteries are  patent. Bilateral posterior communicating arteries are present. There is no significant stenosis or aneurysm. MRA NECK Common, internal, and external carotid arteries are patent. Extracranial vertebral arteries are patent and codominant. There is no measurable stenosis or evidence of dissection. IMPRESSION: No acute infarction, hemorrhage, or mass. Mild chronic microvascular ischemic changes. No hemodynamically significant stenosis in the neck. No proximal intracranial vessel occlusion or significant stenosis. Electronically Signed   By: Macy Mis M.D.   On: 06/23/2020 19:15    EKG: Independently reviewed.  Sinus rhythm no ST changes  Assessment/Plan Active Problems:   CVA (cerebral vascular accident) ([place])   TIA (transient ischemic attack)  (please populate well all problems here in Problem List. (For example, if patient is on BP meds at home and you resume or decide to hold them, it is a problem that needs to be her. Same for CAD, COPD, HLD and so on)  TIA -Most likely from uncontrolled hypertension. -MRI/MRA head reassuring, echo pending.  Telemetry monitoring overnight. -Risk factor modification, for her blood pressure, patient PCP has been following, who switch patient from amlodipine to verapamil few months ago, it appeared that the patient ACEI and HCTZ can be further titrated up. -Most recent A1c 5.3 after starting Metformin for 3 months ago.  Check A1c again. -Lipid panel -Recommend patient stop taking anastrozole, given the significant side effect of hypotension and stroke risk associated with this hormone manipulation medication.  Hypertension -Restart home BP meds, as needed hydralazine  IIDM -Sliding scale for now  Polycythemia -Probably this is another risk factor for TIA, suspect this is related to undertreated OSA -We will send EPO, iron study and outpatient PCP follow-up, or hematology if indicated  Morbid obesity -Outpatient bariatric  evaluation  Gynecomastia -Stop anastrozole  DVT prophylaxis: Lovenox Code Status: Full code Family Communication: None at bedside Disposition Plan: Expect 1 to 2 days hospital stay for stroke work-up Consults called: Neurology Admission status: Telemetry admission   Lequita Halt MD [facility] Pager [phone]  06/23/2020, 8:29 PM

## 2020-06-23 NOTE — ED Notes (Signed)
Placed pt on 2L 02 per [place], because Sa02 was dropping and went to 92%.

## 2020-06-24 ENCOUNTER — Inpatient Hospital Stay (HOSPITAL_BASED_OUTPATIENT_CLINIC_OR_DEPARTMENT_OTHER): Payer: BC Managed Care – PPO

## 2020-06-24 ENCOUNTER — Encounter (HOSPITAL_COMMUNITY): Payer: Self-pay | Admitting: Internal Medicine

## 2020-06-24 DIAGNOSIS — G459 Transient cerebral ischemic attack, unspecified: Secondary | ICD-10-CM

## 2020-06-24 DIAGNOSIS — I160 Hypertensive urgency: Secondary | ICD-10-CM | POA: Diagnosis not present

## 2020-06-24 LAB — CBC WITH DIFFERENTIAL/PLATELET
Abs Immature Granulocytes: 0.05 10*3/uL (ref 0.00–0.07)
Basophils Absolute: 0.1 10*3/uL (ref 0.0–0.1)
Basophils Relative: 1 %
Eosinophils Absolute: 0.1 10*3/uL (ref 0.0–0.5)
Eosinophils Relative: 1 %
HCT: 50.8 % (ref 39.0–52.0)
Hemoglobin: 17.7 g/dL — ABNORMAL HIGH (ref 13.0–17.0)
Immature Granulocytes: 1 %
Lymphocytes Relative: 28 %
Lymphs Abs: 2.7 10*3/uL (ref 0.7–4.0)
MCH: 31.6 pg (ref 26.0–34.0)
MCHC: 34.8 g/dL (ref 30.0–36.0)
MCV: 90.7 fL (ref 80.0–100.0)
Monocytes Absolute: 0.9 10*3/uL (ref 0.1–1.0)
Monocytes Relative: 9 %
Neutro Abs: 5.9 10*3/uL (ref 1.7–7.7)
Neutrophils Relative %: 60 %
Platelets: 153 10*3/uL (ref 150–400)
RBC: 5.6 MIL/uL (ref 4.22–5.81)
RDW: 13.3 % (ref 11.5–15.5)
WBC: 9.7 10*3/uL (ref 4.0–10.5)
nRBC: 0 % (ref 0.0–0.2)

## 2020-06-24 LAB — GLUCOSE, CAPILLARY
Glucose-Capillary: 126 mg/dL — ABNORMAL HIGH (ref 70–99)
Glucose-Capillary: 154 mg/dL — ABNORMAL HIGH (ref 70–99)
Glucose-Capillary: 140 mg/dL — ABNORMAL HIGH (ref 70–99)

## 2020-06-24 LAB — BASIC METABOLIC PANEL
Anion gap: 12 (ref 5–15)
BUN: 7 mg/dL (ref 6–20)
CO2: 26 mmol/L (ref 22–32)
Calcium: 9.7 mg/dL (ref 8.9–10.3)
Chloride: 100 mmol/L (ref 98–111)
Creatinine, Ser: 0.76 mg/dL (ref 0.61–1.24)
GFR, Estimated: >60 mL/min (ref >60)
Glucose, Bld: 140 mg/dL — ABNORMAL HIGH (ref 70–99)
Potassium: 3.1 mmol/L — ABNORMAL LOW (ref 3.5–5.1)
Sodium: 138 mmol/L (ref 135–145)

## 2020-06-24 LAB — ECHOCARDIOGRAM COMPLETE
Area-P 1/2: 2.62 cm2
Height: 72 in
S' Lateral: 3.4 cm
Weight: 5022.96 oz

## 2020-06-24 LAB — LIPID PANEL
Cholesterol: 192 mg/dL (ref 0–200)
HDL: 49 mg/dL (ref >40)
LDL Cholesterol: 98 mg/dL (ref 0–99)
Total CHOL/HDL Ratio: 3.9 RATIO
Triglycerides: 223 mg/dL — ABNORMAL HIGH (ref <150)
VLDL: 45 mg/dL — ABNORMAL HIGH (ref 0–40)

## 2020-06-24 LAB — HEMOGLOBIN A1C
Hgb A1c MFr Bld: 5.6 % (ref 4.8–5.6)
Mean Plasma Glucose: 114.02 mg/dL

## 2020-06-24 MED ORDER — POTASSIUM CHLORIDE CRYS ER 20 MEQ PO TBCR
20.0000 meq | EXTENDED_RELEASE_TABLET | Freq: Two times a day (BID) | ORAL | Status: DC
  Administered 2020-06-24: 20 meq via ORAL
  Filled 2020-06-24: qty 1

## 2020-06-24 MED ORDER — HYDRALAZINE HCL 20 MG/ML IJ SOLN: 10.0000 mg | Freq: Four times a day (QID) | INTRAMUSCULAR | Status: DC | PRN

## 2020-06-24 MED ORDER — PERFLUTREN LIPID MICROSPHERE
1.0000 mL | INTRAVENOUS | Status: AC | PRN
  Administered 2020-06-24: 1 mL via INTRAVENOUS
  Filled 2020-06-24: qty 10

## 2020-06-24 MED ORDER — CLOPIDOGREL BISULFATE 75 MG PO TABS: 75.0000 mg | ORAL_TABLET | Freq: Every day | ORAL | 0 refills | Status: AC

## 2020-06-24 MED ORDER — ASPIRIN EC 81 MG PO TBEC: 81.0000 mg | DELAYED_RELEASE_TABLET | Freq: Every day | ORAL | 0 refills | Status: AC

## 2020-06-24 NOTE — Evaluation (Signed)
Physical Therapy Evaluation Patient Details Name: Samuel Lang MRN: [id number] DOB: 1971/05/24 Today's Date: 06/24/2020   History of Present Illness  49 y.o. 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 CT and MRI unremarkable.  Clinical Impression  Pt presents to PT at his baseline level of function, performing all mobility independently. Pt's only current complaints are of R shoulder pain, reporting he aggravated his R shoulder when moving homes 3 weeks ago. Pt will benefit from outpatient PT services to examen his R shoulder, however the pt does not require acute PT services at this time. Pt is encouraged to ambulate out of the room multiple times a day for the remainder of his hospitalization. Acute PT signing off.    Follow Up Recommendations Outpatient PT (ortho for R shoulder)    Equipment Recommendations  None recommended by PT    Recommendations for Other Services       Precautions / Restrictions Precautions Precautions: None Restrictions Weight Bearing Restrictions: No      Mobility  Bed Mobility Overal bed mobility: Independent                  Transfers Overall transfer level: Independent Equipment used: None                Ambulation/Gait Ambulation/Gait assistance: Independent Gait Distance (Feet): 200 Feet Assistive device: None Gait Pattern/deviations: WFL(Within Functional Limits) Gait velocity: functional Gait velocity interpretation: >2.62 ft/sec, indicative of community ambulatory General Gait Details: steady step through gait, performs multiple dynamic gait tasks without gait deviation  Stairs Stairs:  (pt declines the need for stair assessment)          Wheelchair Mobility    Modified Rankin (Stroke Patients Only) Modified Rankin (Stroke Patients  Only) Pre-Morbid Rankin Score: No symptoms Modified Rankin: No symptoms     Balance Overall balance assessment: Independent                                           Pertinent Vitals/Pain Pain Assessment: 0-10 Pain Score: 3  Pain Descriptors / Indicators: Sore Pain Intervention(s): Monitored during session    Home Living Family/patient expects to be discharged to:: Private residence Living Arrangements: Alone Available Help at Discharge: Family;Available PRN/intermittently Type of Home: House Home Access: Stairs to enter Entrance Stairs-Rails: None Entrance Stairs-Number of Steps: 4 Home Layout: One level Home Equipment: None      Prior Function Level of Independence: Independent               Hand Dominance        Extremity/Trunk Assessment   Upper Extremity Assessment Upper Extremity Assessment: RUE deficits/detail RUE Deficits / Details: grossly 4/5, pain with shoulder flexion    Lower Extremity Assessment Lower Extremity Assessment: Overall WFL for tasks assessed    Cervical / Trunk Assessment Cervical / Trunk Assessment: Normal  Communication   Communication: No difficulties  Cognition Arousal/Alertness: Awake/alert Behavior During Therapy: WFL for tasks assessed/performed Overall Cognitive Status: Within Functional Limits for tasks assessed                                        General Comments General  comments (skin integrity, edema, etc.): VSS on RA    Exercises     Assessment/Plan    PT Assessment Patent does not need any further PT services  PT Problem List         PT Treatment Interventions      PT Goals (Current goals can be found in the Care Plan section)       Frequency     Barriers to discharge        Co-evaluation               AM-PAC PT "6 Clicks" Mobility  Outcome Measure Help needed turning from your back to your side while in a flat bed without using bedrails?:  None Help needed moving from lying on your back to sitting on the side of a flat bed without using bedrails?: None Help needed moving to and from a bed to a chair (including a wheelchair)?: None Help needed standing up from a chair using your arms (e.g., wheelchair or bedside chair)?: None Help needed to walk in hospital room?: None Help needed climbing 3-5 steps with a railing? : None 6 Click Score: 24    End of Session   Activity Tolerance: Patient tolerated treatment well Patient left: in chair;with call bell/phone within reach Nurse Communication: Mobility status      Time: 1100-1118 PT Time Calculation (min) (ACUTE ONLY): 18 min   Charges:   PT Evaluation $PT Eval Low Complexity: [place], PT, DPT Acute Rehabilitation Pager: [phone]   Zenaida Niece 06/24/2020, 1:13 PM

## 2020-06-24 NOTE — Evaluation (Signed)
Speech Language Pathology Evaluation Patient Details Name: Samuel Lang MRN: [id number] DOB: 1970/11/11 Today's Date: 06/24/2020 Time: 8921-1941 SLP Time Calculation (min) (ACUTE ONLY): 31 min  Problem List:  Patient Active Problem List   Diagnosis Date Noted  . CVA (cerebral vascular accident) ([place]) 06/23/2020  . TIA (transient ischemic attack) 06/23/2020  . Routine general medical examination at a health care facility 12/05/2016  . Hyperlipidemia 01/29/2016  . Essential hypertension 01/29/2016  . Adjustment disorder with depressed mood 01/29/2016  . Arthritis 04/06/2015  . Morbid obesity ([place]) 04/06/2015  . Allergic rhinitis due to pollen 10/10/2010  . Asthma, mild intermittent, well-controlled 04/01/2008   Past Medical History:  Past Medical History:  Diagnosis Date  . Allergic rhinitis   . Arthritis   . Asthma   . Colon polyps   . Depression   . Diabetes mellitus without complication ([place])   . Diverticulitis   . Enlarged prostate   . Fatty liver   . Hernia, umbilical   . Hyperlipidemia   . Hypertension   . Sciatic nerve pain   . Sleep apnea    wears CPAP at night  . UTI (lower urinary tract infection)    Past Surgical History:  Past Surgical History:  Procedure Laterality Date  . INSERTION OF MESH N/A 06/01/2019   Procedure: Insertion Of Mesh;  Surgeon: Donnie Mesa, MD;  Location: [facility];  Service: General;  Laterality: N/A;  . traumatic pneumothorax sledding accident,rib rx, left pneumothorax, chest tube    . UMBILICAL HERNIA REPAIR N/A 06/01/2019   Procedure: HERNIA REPAIR UMBILICAL WITH MESH;  Surgeon: Donnie Mesa, MD;  Location: [facility];  Service: General;  Laterality: N/A;   HPI: 49 y.o.malepast medical history of hypertension, hyperlipidemia, obstructive sleep apnea, obesity, presenting to the emergency room with last known normal around 11 AM 06/23/20 when he was at work and noticed sudden onset of inability to comprehend tasks noted around 11:30 AM.  He also had significant difficulty with his speech that he describes as word finding difficulty and difficulty processing information while at work.  MRI revealed on 06/23/20 No acute infarction, hemorrhage, or mass. Mild chronic microvascular ischemic changes     Assessment / Plan / Recommendation Clinical Impression  Pt administered the SLUMS ([place] Mental Status Examination) with a score obtained of 25/30 with 27/30 typical score for this assessment.  He answered 3/4 questions (75%) within simple paragraph, exhibited simple calculation errors and recalled 4/5 words after time contraint (80% accuracy).  He disclosed he may have "undiagnosed ADHD and sleep deprivation d/t OSA."  He also exhibited visual-spatial changes with clock formation when asked to place numbers on clock face.  He drew lines segmenting clock into sections vs placing numbers on clock.  Pt required repetition for certain tasks as well throughout assessment despite limiting distractions during SLE.  ST will f/u while in acute setting for cognitive impairment.  If these problems persist, a full cognitive assessment may be warranted.  Thank you for this consult.    SLP Assessment  SLP Recommendation/Assessment: Patient needs continued Speech Language Pathology Services SLP Visit Diagnosis: Cognitive communication deficit (R41.841)    Follow Up Recommendations  Outpatient SLP (if problems persist)    Frequency and Duration min 2x/week  1 week      SLP Evaluation Cognition  Overall Cognitive Status: Within Functional Limits for tasks assessed Arousal/Alertness: Awake/alert Orientation Level: Oriented X4 Attention: Sustained Sustained Attention: Impaired Sustained Attention Impairment: Verbal basic;Functional basic Memory: Impaired Memory Impairment: Retrieval  deficit Immediate Memory Recall: Sock;Blue;Bed Memory Recall Sock: Without Cue Memory Recall Blue: Without Cue Memory Recall Bed: With  Cue Awareness: Appears intact Problem Solving: Appears intact Safety/Judgment: Appears intact       Comprehension  Auditory Comprehension Overall Auditory Comprehension: Appears within functional limits for tasks assessed Yes/No Questions: Within Functional Limits Commands: Within Functional Limits Conversation: Simple Interfering Components: Attention EffectiveTechniques: Repetition Visual Recognition/Discrimination Discrimination: Within Function Limits Reading Comprehension Reading Status: Within funtional limits    Expression Expression Primary Mode of Expression: Verbal Verbal Expression Overall Verbal Expression: Appears within functional limits for tasks assessed Initiation: No impairment Level of Generative/Spontaneous Verbalization: Conversation Repetition: No impairment Naming: No impairment Pragmatics: No impairment Non-Verbal Means of Communication: Not applicable Written Expression Dominant Hand: Right Written Expression: Within Functional Limits   Oral / Motor  Oral Motor/Sensory Function Overall Oral Motor/Sensory Function: Within functional limits Motor Speech Overall Motor Speech: Appears within functional limits for tasks assessed Respiration: Within functional limits Phonation: Normal Resonance: Within functional limits Articulation: Within functional limitis Intelligibility: Intelligible Motor Planning: Witnin functional limits Motor Speech Errors: Not applicable                      Elvina Sidle, M.S., CCC-SLP 06/24/2020, 2:16 PM

## 2020-06-24 NOTE — Evaluation (Signed)
Occupational Therapy Evaluation Patient Details Name: Samuel Lang MRN: [id number] DOB: 25-Apr-1971 Today's Date: 06/24/2020    History of Present Illness 49 y.o. 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 CT and MRI unremarkable.   Clinical Impression   PTA, pt was living at home alone, pt reports he was independent with ADL/IADL and functional mobiltiy. Pt currently functioning at baseline level. Pt reports no limitations or differences from baseline. Pt completed ADL and functional mobility at independent level. Patient evaluated by Occupational Therapy with no further acute OT needs identified. All education has been completed and the patient has no further questions. See below for any follow-up Occupational Therapy or equipment needs. OT to sign off. Thank you for referral.      Follow Up Recommendations  No OT follow up    Equipment Recommendations  None recommended by OT    Recommendations for Other Services       Precautions / Restrictions Precautions Precautions: None Restrictions Weight Bearing Restrictions: No      Mobility Bed Mobility Overal bed mobility: Independent                  Transfers Overall transfer level: Independent Equipment used: None                  Balance Overall balance assessment: Independent                                         ADL either performed or assessed with clinical judgement   ADL Overall ADL's : Independent                                             Vision Baseline Vision/History: No visual deficits       Perception     Praxis      Pertinent Vitals/Pain Pain Assessment: No/denies pain Pain Score: 3  Pain Descriptors / Indicators: Sore Pain Intervention(s): Monitored during  session     Hand Dominance Right   Extremity/Trunk Assessment Upper Extremity Assessment Upper Extremity Assessment: RUE deficits/detail RUE Deficits / Details: grossly 4/5, pain with shoulder flexion   Lower Extremity Assessment Lower Extremity Assessment: Overall WFL for tasks assessed   Cervical / Trunk Assessment Cervical / Trunk Assessment: Normal   Communication Communication Communication: No difficulties   Cognition Arousal/Alertness: Awake/alert Behavior During Therapy: WFL for tasks assessed/performed Overall Cognitive Status: Within Functional Limits for tasks assessed                                     General Comments  VSS on RA    Exercises     Shoulder Instructions      Home Living Family/patient expects to be discharged to:: Private residence Living Arrangements: Alone Available Help at Discharge: Family;Available PRN/intermittently Type of Home: House Home Access: Stairs to enter [employer] of Steps: 4 Entrance Stairs-Rails: None Home Layout: One level     Bathroom Shower/Tub: Teacher, early years/pre: Standard Bathroom Accessibility: No   Home Equipment: None  Prior Functioning/Environment Level of Independence: Independent                 OT Problem List: Decreased safety awareness      OT Treatment/Interventions:      OT Goals(Current goals can be found in the care plan section) Acute Rehab OT Goals Patient Stated Goal: to go home OT Goal Formulation: With patient Time For Goal Achievement: 07/01/20 Potential to Achieve Goals: Good  OT Frequency:     Barriers to D/C:            Co-evaluation              AM-PAC OT "6 Clicks" Daily Activity     Outcome Measure Help from another person eating meals?: None Help from another person taking care of personal grooming?: None Help from another person toileting, which includes using toliet, bedpan, or urinal?: None Help  from another person bathing (including washing, rinsing, drying)?: None Help from another person to put on and taking off regular upper body clothing?: None Help from another person to put on and taking off regular lower body clothing?: None 6 Click Score: 24   End of Session Nurse Communication: Mobility status  Activity Tolerance: Patient tolerated treatment well Patient left: in chair;with call bell/phone within reach  OT Visit Diagnosis: Other abnormalities of gait and mobility (R26.89)                Time: 4825-0037 OT Time Calculation (min): 15 min Charges:  OT General Charges $OT Visit: 1 Visit OT Evaluation $OT Eval Low Complexity: 1 Low  Tayven Renteria OTR/L Acute Rehabilitation Services Office: [place] 06/24/2020, 1:24 PM

## 2020-06-24 NOTE — Progress Notes (Signed)
*  PRELIMINARY RESULTS* Echocardiogram 2D Echocardiogram with definity has been performed.  Samuel Lang 06/24/2020, 1:55 PM

## 2020-06-24 NOTE — Plan of Care (Signed)

## 2020-06-24 NOTE — Progress Notes (Signed)
Patient discharged home. PIV and tele removed. VS stable. Discharge instructions explained and patient expressed understanding.

## 2020-06-24 NOTE — Progress Notes (Signed)
STROKE TEAM PROGRESS NOTE   HISTORY OF PRESENT ILLNESS (per record) Samuel Lang is a 49 y.o. male past medical history of hypertension, hyperlipidemia, obstructive sleep apnea, obesity, presenting to the emergency room with last known normal around 11 AM yesterday when he was at work and noticed sudden onset of inability to comprehend tasks noted around 11:30 AM.  He also had significant difficulty with his speech that he describes as word finding difficulty as well as fogginess in his thought. He drove himself to the hospital and his symptoms lasted somewhere around 1 to 2 hours.  At this point he reports that he feels nearly back to normal but has somewhat of not feeling right sensation which she cannot describe clearly. Going back about a week, he says that he has been feeling unwell for a week-feeling lightheaded and dizzy on multiple occasions.  The last few months, he has had episodes where while driving he almost felt like he slept on the wheel for a brief incident.  This has not happened before and these 2 episodes have been relatively recent.  He got to the destination where he was driving to and had no symptoms-1 time while going to his brother's place, but on the way back had similar symptoms. He has sleep apnea and uses a CPAP but is unable to use it consistently for multiple hours as he sometimes falls asleep on the couch. He did not have any tingling numbness or weakness. No visual symptoms. No headaches Has not had stroke in the past No chest pain shortness of breath fevers chills or sick exposures at this time. Of note-blood pressures have been elevated in the emergency room.  Is on 3 antihypertensive medications at home and says that usually has good control of his blood pressure at home.   LKW: 11 a.m. on 06/23/2020 tpa given?: no, outside the window, NIH 0 on presentation Premorbid modified Rankin scale (mRS):0 ROS: Performed and is negative except as noted in HPI.   INTERVAL  HISTORY He reports he feels he is at baseline.  No recurrent episodes of dysarthria is reported.  The patient apparently has had difficulties with hypersomnia and dozing when watching television or infrequently while driving.  He is cautioned about this.  He does have a CPAP machine which he uses only for about half the night however.  We have encouraged him to use this more appropriately.  He has been up ambulating in the hallway today.    OBJECTIVE Vitals:   06/24/20 0415 06/24/20 0607 06/24/20 0845 06/24/20 1143  BP: (!) 159/75 (!) 170/95 (!) 161/90 (!) 175/91  Pulse: (!) 59 64 67 (!) 58  Resp:   18 20  Temp: 97.9 F (36.6 C)  98 F (36.7 C) 98.3 F (36.8 C)  TempSrc: Oral  Oral Oral  SpO2: 96%  93% 97%  Weight:      Height:        CBC:  Recent Labs  Lab 06/23/20 1439 06/23/20 1439 06/23/20 1447 06/24/20 0237  WBC 9.3  --   --  9.7  NEUTROABS 6.1  --   --  5.9  HGB 18.2*   < > 19.0* 17.7*  HCT 54.2*   < > 56.0* 50.8  MCV 91.6  --   --  90.7  PLT 151  --   --  153   < > = values in this interval not displayed.    Basic Metabolic Panel:  Recent Labs  Lab 06/23/20 1439 06/23/20  1439 06/23/20 1447 06/24/20 0237  NA 136   < > 138 138  K 3.5   < > 3.4* 3.1*  CL 100   < > 98 100  CO2 25  --   --  26  GLUCOSE 219*   < > 209* 140*  BUN 6   < > 7 7  CREATININE 0.81   < > 0.60* 0.76  CALCIUM 9.8  --   --  9.7   < > = values in this interval not displayed.    Lipid Panel:     Component Value Date/Time   CHOL 192 06/24/2020 0237   TRIG 223 (H) 06/24/2020 0237   HDL 49 06/24/2020 0237   CHOLHDL 3.9 06/24/2020 0237   VLDL 45 (H) 06/24/2020 0237   LDLCALC 98 06/24/2020 0237   HgbA1c:  Lab Results  Component Value Date   HGBA1C 5.6 06/24/2020   Urine Drug Screen:     Component Value Date/Time   LABOPIA NONE DETECTED 06/23/2020 2016   COCAINSCRNUR NONE DETECTED 06/23/2020 2016   LABBENZ NONE DETECTED 06/23/2020 2016   AMPHETMU NONE DETECTED 06/23/2020 2016    THCU NONE DETECTED 06/23/2020 2016   LABBARB NONE DETECTED 06/23/2020 2016    Alcohol Level     Component Value Date/Time   ETH <10 06/23/2020 1936    IMAGING  CT HEAD WO CONTRAST 06/23/2020 IMPRESSION:  1. No evidence of acute intracranial abnormality.  2. Patchy white matter hypoattenuation, most commonly related to chronic microvascular ischemic disease.   MR ANGIO HEAD WO CONTRAST 06/23/2020 IMPRESSION:  No acute infarction, hemorrhage, or mass. Mild chronic microvascular ischemic changes. No hemodynamically significant stenosis in the neck. No proximal intracranial vessel occlusion or significant stenosis.    MR BRAIN WO CONTRAST MR Angiogram Neck W or Wo Contrast 06/23/2020 IMPRESSION:  No acute infarction, hemorrhage, or mass. Mild chronic microvascular ischemic changes. No hemodynamically significant stenosis in the neck. No proximal intracranial vessel occlusion or significant stenosis.    MR BRAIN WO CONTRAST 06/23/2020  IMPRESSION:  No acute infarction, hemorrhage, or mass. Mild chronic microvascular ischemic changes. No hemodynamically significant stenosis in the neck. No proximal intracranial vessel occlusion or significant stenosis.   Transthoracic Echocardiogram  00/00/2021 Pending  ECG - SR rate 63 BPM. (See cardiology reading for complete details)  PHYSICAL EXAM Blood pressure (!) 175/91, pulse (!) 58, temperature 98.3 F (36.8 C), temperature source Oral, resp. rate 20, height 6' (1.829 m), weight (!) 142.4 kg, SpO2 97 %. GENERAL: He is doing well at this time.  HEENT: He has a large neck and crowded airway.  Neck is supple.  ABDOMEN: soft  EXTREMITIES: No edema   BACK: Normal  SKIN: Normal by inspection.    MENTAL STATUS: Alert and oriented. Speech, language and cognition are generally intact. Judgment and insight normal.   CRANIAL NERVES: Pupils are equal, round and reactive to light and accomodation; extra ocular movements are full,  there is no significant nystagmus; visual fields are full; upper and lower facial muscles are normal in strength and symmetric, there is no flattening of the nasolabial folds; tongue is midline; uvula is midline; shoulder elevation is normal.  MOTOR: Normal tone, bulk and strength; no pronator drift.  There is no drift.  COORDINATION: Left finger to nose is normal, right finger to nose is normal, No rest tremor; no intention tremor; no postural tremor; no bradykinesia.  GAIT: Normal.  ASSESSMENT/PLAN Mr. Samuel Lang is a 49 y.o. male with history of  hypertension, hyperlipidemia, obstructive sleep apnea, DM, obesity, presenting to the emergency room with last known normal around 11 AM yesterday when he was at work and noticed sudden onset of inability to comprehend tasks, difficulty with his speech and fogginess in his thought.. In ED BP elevated. He did not receive IV t-PA due to late presentation (>4.5 hours from time of onset) and NIH zero.  Possible TIA:   Resultant no residual findings.  Code Stroke CT Head - not ordered  CT head - No evidence of acute intracranial abnormality. Patchy white matter hypoattenuation, most commonly related to chronic microvascular ischemic disease.   MRI head - No acute infarction, hemorrhage, or mass. Mild chronic microvascular ischemic changes.   MRA head and Neck - No hemodynamically significant stenosis in the neck. No proximal intracranial vessel occlusion or significant stenosis  CTA H&N - not ordered  CT Perfusion - not ordered  Carotid Doppler - MRA neck ordered - carotid dopplers not indicated  2D Echo - pending  Sars Corona Virus 2 - negative  LDL - 98  HgbA1c - pending  UDS - negative  VTE prophylaxis - Lovenox Diet  Diet Order            Diet heart healthy/carb modified Room service appropriate? Yes; Fluid consistency: Thin  Diet effective now                 No antithrombotic prior to  admission, now on aspirin 325 mg daily  Patient counseled to be compliant with his antithrombotic medications  Ongoing aggressive stroke risk factor management  Therapy recommendations: Outpt PT  Disposition:  Pending  Hypertension  Home BP meds: Benicar  Current BP meds: irbesartan ; verapamil ; HCTZ ; metoprolol ; hydralazine prn  Stable . Permissive hypertension (OK if < 220/120) but gradually normalize in 5-7 days  . Long-term BP goal normotensive  Hyperlipidemia  Home Lipid lowering medication: Crestor 20 mg daily  LDL 98, goal < 70  Current lipid lowering medication: Crestor 20 mg daily - Consider changing to Lipitor 80 mg daily  Continue statin at discharge  Diabetes  Home diabetic meds: metformin  Current diabetic meds: insulin  HgbA1c pending, goal < 7.0 Recent Labs    06/23/20 2218 06/24/20 0631 06/24/20 1101  GLUCAP 130* 126* 154*    Other Stroke Risk Factors  ETOH use, advised to drink no more than 1 alcoholic beverage per day.  Obesity, Body mass index is 42.58 kg/m., recommend weight loss, diet and exercise as appropriate   Family hx stroke (father)   Obstructive sleep apnea, on CPAP at home  Substance abuse - marijuana  Other Active Problems  Code status - Full code   Hypokalemia - potassium - 3.1 - supplement and recheck  Hospital day # 1  Dr. Merlene Laughter -- The patient was seen and examined by me; notes, chart and tests reviewed and discussed with midlevel provider, other providers, patient, and family.  Patient should continue with Crestor.  Dual antiplatelet agents are recommended for 3 weeks and then subsequently a single agent either aspirin or Plavix.  Continue with other risk factor modifications.   To contact Stroke Continuity provider, please refer to [email or web address] After hours, contact General Neurology

## 2020-06-24 NOTE — Progress Notes (Signed)
Pt refusing AM sliding scale. "I do not take insulin and I am not going to start today". Pt exteremly kind and respectful in tone, but does not feeling comfortable starting insulin injections at this time. Pt educated on that Metformin is not allows available while in the hospital, but the doctor can elaborate on that more.

## 2020-06-25 NOTE — Discharge Summary (Signed)
Physician Discharge Summary  Samuel Lang KWI:[id number] DOB: 14-Sep-1970 DOA: 06/23/2020  PCP: Hayden Rasmussen, MD  Admit date: 06/23/2020 Discharge date: 06/25/2020  Admitted From: Home Discharge disposition: Home   Code Status: Prior  Diet Recommendation: Cardiac/diabetic diet  Discharge Diagnosis:   Principal Problem:   TIA (transient ischemic attack) Active Problems:   Morbid obesity ([place])   Essential hypertension   History of Present Illness / Brief narrative:  Patient is a 49 year old male with PMH significant for refractory HTN, HLD, morbid obesity, obstructive sleep apnea on CPAP, gynecomastia on hormone manipulation. Patient presented to the ED on 11/19 with speech disturbance. Last known normal around 11 AM on 11/19.  While at work he noticed sudden onset of inability to comprehend task.   He had significant difficulty with his speech as well as fogginess in his thought. He drove himself to the hospital and his symptoms lasted for around 1 to 2 hours.  Of note, patient reports episodes of falling asleep while driving in the last few months.  In the recent weeks, patient also had multiple episodes of lightheadedness and dizziness.    In the ED, his blood pressure was elevated up to 192/94. He had mild symptoms and was outside window for TPA.  Symptoms lasted for 1 to 2 hours and resolved while in the ED. Labs with potassium low at 3.4, hemoglobin was elevated at 19 CT scan of the brain did not show any acute changes. MRI brain did not show any acute infarction hemorrhage or mass. MRI head and neck did not show any significant stenosis. Patient was admitted to hospitalist service.  Neurology consultation was obtained.  Subjective:  Patient was seen and examined this morning. Pleasant middle-aged Caucasian male.  Lying down in bed.  Not in distress. Chart reviewed. Blood pressure elevated to 170/95.  Hospital Course:  Transient ischemic attack -Presented with  sudden onset of confusion and word finding difficulty that lasted about 1 to 2 hours. -Symptoms most likely because of TIA.  Also possibility of hypertensive emergency given significant elevated blood pressure on arrival.   -TIA risk factors include uncontrolled hypertension, hyperlipidemia, morbid obesity, sleep apnea, use of hormones. -Imagings as above with negative findings -Risk factor modification suggested. -A1c 5.6, LDL 98 -Echocardiogram obtained.  EF 55 to 60%.  No intracardiac source of embolism identified -PT/OT evaluation obtained.  No need to follow-up -Neurology consult appreciated. -Prior to admission, patient was on Crestor 20 mg daily.  Continue the same. -Patient has been started on dual antiplatelet agents with aspirin 81 mg daily and Plavix 75 mg daily for next 3 weeks.  After that, patient will stop Plavix and continue aspirin 81 mg daily only.  Hypertensive urgency History of refractory hypertension -Blood pressure significantly better to 190s on presentation. -Reports compliance to home medicines which include Bystolic 20 mg daily, verapamil 120 mg daily, olmesartan/HCTZ 40 mg / 12.5 mg -Blood pressure improving with the continuation of same medicines.  Reinforced compliance. -Continue to monitor blood pressure at home.  Prediabetes -A1c 5.6. -On Metformin at home.  I do not think he needs to continue Metformin.  Patient to see endocrinologist as an outpatient  Morbid obesity - Body mass index is 42.58 kg/m. Patient has been advised to make an attempt to improve diet and exercise patterns to aid in weight loss. -Also recommend bariatric evaluation as an outpatient  Obstructive sleep apnea -Encouraged to improve compliance to CPAP.  Male hypogonadism -Patient states he has low level of  testosterone.  Has gynecomastia. -Patient states he used to see endocrinologist and was on testosterone.  It was later switched to anastrozole by urologist.  Patient has not  followed up with endocrinologist in a long while.  I recommend him to follow-up with endocrinologist to see if he can come off any hormone replacement therapy. -Currently on anastrozole 0.25 mg daily at home.  Recommend follow-up with endocrinologist -Hormone supplement can also lead to increased risk of stroke.  Ideally would recommend to stop it.  Polycythemia -Probably secondary to hormone supplement and untreated sleep apnea. -May also increase the risk of TIA by increasing blood viscosity. Recent Labs    06/23/20 1439 06/23/20 1447 06/24/20 0237  HGB 18.2* 19.0* 17.7*   Anxiety/depression -On Wellbutrin   BPH -Continue finasteride  Stable for discharge home today.  Wound care: Incision (Closed) 06/01/19 Abdomen Other (Comment) (Active)  Date First Assessed/Time First Assessed: 06/01/19 0826   Location: Abdomen  Location Orientation: Other (Comment)    Assessments 06/01/2019  8:49 AM 06/01/2019 10:17 AM  Dressing Type Gauze (Comment);Transparent dressing Gauze (Comment);Transparent dressing  Dressing Clean;Dry;Intact Clean;Dry;Intact  Site / Wound Assessment Dressing in place / Unable to assess Dressing in place / Unable to assess  Drainage Amount -- None     No Linked orders to display    Discharge Exam:   Vitals:   06/24/20 0607 06/24/20 0845 06/24/20 1143 06/24/20 1650  BP: (!) 170/95 (!) 161/90 (!) 175/91 (!) 165/88  Pulse: 64 67 (!) 58 (!) 56  Resp:  18 20 20   Temp:  98 F (36.7 C) 98.3 F (36.8 C) 98.2 F (36.8 C)  TempSrc:  Oral Oral Oral  SpO2:  93% 97% 95%  Weight:      Height:        Body mass index is 42.58 kg/m.  General exam: Not in physical distress.  Morbidly obese. Skin: No rashes, lesions or ulcers. HEENT: Atraumatic, normocephalic, no obvious bleeding Lungs: Clear to auscultation bilaterally CVS: Regular rate and rhythm, no murmur GI/Abd soft, nontender, distended from obesity, bowel sound present CNS: Alert, awake, oriented x3.   Dysarthria resolved Psychiatry: Mood appropriate Extremities: No pedal edema, no calf tenderness  Follow ups:   Discharge Instructions    Diet - low sodium heart healthy   Complete by: As directed    Diet Carb Modified   Complete by: As directed    Increase activity slowly   Complete by: As directed       Follow-up Information    Hayden Rasmussen, MD Follow up.   Specialty: Family Medicine Contact information: [address] [place] [address] [phone]               Recommendations for Outpatient Follow-Up:   1. Follow-up PCP as an outpatient 2. Follow-up with endocrinologist as an outpatient  Discharge Instructions:  Follow with Primary MD Hayden Rasmussen, MD in 7 days   Get CBC/BMP checked in next visit within 1 week by PCP or SNF MD ( we routinely change or add medications that can affect your baseline labs and fluid status, therefore we recommend that you get the mentioned basic workup next visit with your PCP, your PCP may decide not to get them or add new tests based on their clinical decision)  On your next visit with your PCP, please Get Medicines reviewed and adjusted.  Please request your PCP  to go over all Hospital Tests and Procedure/Radiological results at the follow  up, please get all Hospital records sent to your Prim MD by signing hospital release before you go home.  Activity: As tolerated with Full fall precautions use walker/cane & assistance as needed  For Heart failure patients - Check your Weight same time everyday, if you gain over 2 pounds, or you develop in leg swelling, experience more shortness of breath or chest pain, call your Primary MD immediately. Follow Cardiac Low Salt Diet and 1.5 lit/day fluid restriction.  If you have smoked or chewed Tobacco in the last 2 yrs please stop smoking, stop any regular Alcohol  and or any Recreational drug use.  If you experience worsening of your admission symptoms, develop  shortness of breath, life threatening emergency, suicidal or homicidal thoughts you must seek medical attention immediately by calling 911 or calling your MD immediately  if symptoms less severe.  You Must read complete instructions/literature along with all the possible adverse reactions/side effects for all the Medicines you take and that have been prescribed to you. Take any new Medicines after you have completely understood and accpet all the possible adverse reactions/side effects.   Do not drive, operate heavy machinery, perform activities at heights, swimming or participation in water activities or provide baby sitting services if your were admitted for syncope or siezures until you have seen by Primary MD or a Neurologist and advised to do so again.  Do not drive when taking Pain medications.  Do not take more than prescribed Pain, Sleep and Anxiety Medications  Wear Seat belts while driving.   Please note You were cared for by a hospitalist during your hospital stay. If you have any questions about your discharge medications or the care you received while you were in the hospital after you are discharged, you can call the unit and asked to speak with the hospitalist on call if the hospitalist that took care of you is not available. Once you are discharged, your primary care physician will handle any further medical issues. Please note that NO REFILLS for any discharge medications will be authorized once you are discharged, as it is imperative that you return to your primary care physician (or establish a relationship with a primary care physician if you do not have one) for your aftercare needs so that they can reassess your need for medications and monitor your lab values.    Allergies as of 06/24/2020      Reactions   Amlodipine Swelling, Other (See Comments)   Made the feet swell      Medication List    TAKE these medications   albuterol 108 (90 Base) MCG/ACT inhaler Commonly  known as: VENTOLIN HFA Inhale 1-2 puffs into the lungs every 6 (six) hours as needed for wheezing or shortness of breath.   anastrozole 1 MG tablet Commonly known as: ARIMIDEX Take 0.25 mg by mouth daily.   aspirin EC 81 MG tablet Take 1 tablet (81 mg total) by mouth daily. Swallow whole.   buPROPion 100 MG 12 hr tablet Commonly known as: WELLBUTRIN SR Take 100 mg by mouth in the morning.   Bystolic 20 MG Tabs Generic drug: Nebivolol HCl Take 20 mg by mouth daily.   clopidogrel 75 MG tablet Commonly known as: Plavix Take 1 tablet (75 mg total) by mouth daily for 21 days.   DULoxetine 60 MG capsule Commonly known as: CYMBALTA Take 1 capsule (60 mg total) by mouth daily.   finasteride 5 MG tablet Commonly known as: PROSCAR Take 5 mg by  mouth daily.   ibuprofen 200 MG tablet Commonly known as: ADVIL Take 400 mg by mouth every 6 (six) hours as needed for headache, mild pain or moderate pain.   loratadine 10 MG tablet Commonly known as: CLARITIN Take 10 mg by mouth daily as needed for allergies or rhinitis.   Magnesium Oxide 420 (252 Mg) MG Tabs Take 420 mg by mouth daily.   metFORMIN 500 MG 24 hr tablet Commonly known as: GLUCOPHAGE-XR Take 500 mg by mouth daily with breakfast.   Nasacort Allergy 24HR 55 MCG/ACT Aero nasal inhaler Generic drug: triamcinolone Place 2 sprays into the nose daily as needed (allergies).   olmesartan-hydrochlorothiazide 40-12.5 MG tablet Commonly known as: BENICAR HCT Take 1 tablet by mouth daily.   omeprazole 40 MG capsule Commonly known as: PRILOSEC Take 40 mg by mouth daily before breakfast.   oxyCODONE 5 MG immediate release tablet Commonly known as: Oxy IR/ROXICODONE Take 1 tablet (5 mg total) by mouth every 6 (six) hours as needed for severe pain.   rosuvastatin 20 MG tablet Commonly known as: CRESTOR Take 1 tablet (20 mg total) by mouth daily. Needs annual appointment for further refills What changed: additional  instructions   sildenafil 25 MG tablet Commonly known as: VIAGRA Take 25-50 mg by mouth daily as needed for erectile dysfunction.   verapamil 120 MG CR tablet Commonly known as: CALAN-SR Take 120 mg by mouth in the morning.       Time coordinating discharge: 35 minutes  The results of significant diagnostics from this hospitalization (including imaging, microbiology, ancillary and laboratory) are listed below for reference.    Procedures and Diagnostic Studies:   CT HEAD WO CONTRAST  Result Date: 06/23/2020 CLINICAL DATA:  Neuro deficit, acute stroke suspected. Altered mental status, confusion, aphasia. EXAM: CT HEAD WITHOUT CONTRAST TECHNIQUE: Contiguous axial images were obtained from the base of the skull through the vertex without intravenous contrast. COMPARISON:  MRI 04/29/2013. FINDINGS: Brain: No evidence of acute large vascular territory infarction, hemorrhage, hydrocephalus, extra-axial collection or mass lesion/mass effect. Patchy white matter hypoattenuation, most commonly related to chronic microvascular ischemic disease. Vascular: No hyperdense vessel or unexpected calcification. Skull: No acute fracture. Sinuses/Orbits: No acute finding. Other: No mastoid effusions. IMPRESSION: 1. No evidence of acute intracranial abnormality. 2. Patchy white matter hypoattenuation, most commonly related to chronic microvascular ischemic disease. Electronically Signed   By: Margaretha Sheffield MD   On: 06/23/2020 16:45   MR ANGIO HEAD WO CONTRAST  Result Date: 06/23/2020 CLINICAL DATA:  TIA EXAM: MRI HEAD WITHOUT CONTRAST MRA HEAD WITHOUT CONTRAST MRA NECK WITHOUT AND WITH CONTRAST TECHNIQUE: Multiplanar, multiecho pulse sequences of the brain and surrounding structures were obtained without intravenous contrast. Angiographic images of the Circle of Willis were obtained using MRA technique without intravenous contrast. Angiographic images of the neck were obtained using MRA technique without  and with intravenous contrast. Carotid stenosis measurements (when applicable) are obtained utilizing NASCET criteria, using the distal internal carotid diameter as the denominator. CONTRAST:  59mL GADAVIST GADOBUTROL 1 MMOL/ML IV SOLN COMPARISON:  MRI 2014 FINDINGS: MRI HEAD Brain: There is no acute infarction or intracranial hemorrhage. There is no intracranial mass, mass effect, or edema. There is no hydrocephalus or extra-axial fluid collection. Ventricles and sulci are normal in size and configuration. Patchy foci of T2 hyperintensity in the supratentorial white matter are nonspecific but may reflect mild chronic microvascular ischemic changes. Vascular: Major vessel flow voids at the skull base are preserved. Skull and upper cervical spine:  Normal marrow signal is preserved. Sinuses/Orbits: Paranasal sinuses are aerated. Orbits are unremarkable. Other: Incidental note is made of a partially empty without expansion as seen previously. Mastoid air cells are clear. MRA HEAD Intracranial internal carotid arteries are patent. Middle and anterior cerebral arteries are patent. Intracranial vertebral arteries, basilar artery, posterior cerebral arteries are patent. Bilateral posterior communicating arteries are present. There is no significant stenosis or aneurysm. MRA NECK Common, internal, and external carotid arteries are patent. Extracranial vertebral arteries are patent and codominant. There is no measurable stenosis or evidence of dissection. IMPRESSION: No acute infarction, hemorrhage, or mass. Mild chronic microvascular ischemic changes. No hemodynamically significant stenosis in the neck. No proximal intracranial vessel occlusion or significant stenosis. Electronically Signed   By: Macy Mis M.D.   On: 06/23/2020 19:15   MR Angiogram Neck W or Wo Contrast  Result Date: 06/23/2020 CLINICAL DATA:  TIA EXAM: MRI HEAD WITHOUT CONTRAST MRA HEAD WITHOUT CONTRAST MRA NECK WITHOUT AND WITH CONTRAST  TECHNIQUE: Multiplanar, multiecho pulse sequences of the brain and surrounding structures were obtained without intravenous contrast. Angiographic images of the Circle of Willis were obtained using MRA technique without intravenous contrast. Angiographic images of the neck were obtained using MRA technique without and with intravenous contrast. Carotid stenosis measurements (when applicable) are obtained utilizing NASCET criteria, using the distal internal carotid diameter as the denominator. CONTRAST:  53mL GADAVIST GADOBUTROL 1 MMOL/ML IV SOLN COMPARISON:  MRI 2014 FINDINGS: MRI HEAD Brain: There is no acute infarction or intracranial hemorrhage. There is no intracranial mass, mass effect, or edema. There is no hydrocephalus or extra-axial fluid collection. Ventricles and sulci are normal in size and configuration. Patchy foci of T2 hyperintensity in the supratentorial white matter are nonspecific but may reflect mild chronic microvascular ischemic changes. Vascular: Major vessel flow voids at the skull base are preserved. Skull and upper cervical spine: Normal marrow signal is preserved. Sinuses/Orbits: Paranasal sinuses are aerated. Orbits are unremarkable. Other: Incidental note is made of a partially empty without expansion as seen previously. Mastoid air cells are clear. MRA HEAD Intracranial internal carotid arteries are patent. Middle and anterior cerebral arteries are patent. Intracranial vertebral arteries, basilar artery, posterior cerebral arteries are patent. Bilateral posterior communicating arteries are present. There is no significant stenosis or aneurysm. MRA NECK Common, internal, and external carotid arteries are patent. Extracranial vertebral arteries are patent and codominant. There is no measurable stenosis or evidence of dissection. IMPRESSION: No acute infarction, hemorrhage, or mass. Mild chronic microvascular ischemic changes. No hemodynamically significant stenosis in the neck. No  proximal intracranial vessel occlusion or significant stenosis. Electronically Signed   By: Macy Mis M.D.   On: 06/23/2020 19:15   MR BRAIN WO CONTRAST  Result Date: 06/23/2020 CLINICAL DATA:  TIA EXAM: MRI HEAD WITHOUT CONTRAST MRA HEAD WITHOUT CONTRAST MRA NECK WITHOUT AND WITH CONTRAST TECHNIQUE: Multiplanar, multiecho pulse sequences of the brain and surrounding structures were obtained without intravenous contrast. Angiographic images of the Circle of Willis were obtained using MRA technique without intravenous contrast. Angiographic images of the neck were obtained using MRA technique without and with intravenous contrast. Carotid stenosis measurements (when applicable) are obtained utilizing NASCET criteria, using the distal internal carotid diameter as the denominator. CONTRAST:  61mL GADAVIST GADOBUTROL 1 MMOL/ML IV SOLN COMPARISON:  MRI 2014 FINDINGS: MRI HEAD Brain: There is no acute infarction or intracranial hemorrhage. There is no intracranial mass, mass effect, or edema. There is no hydrocephalus or extra-axial fluid collection. Ventricles and sulci are  normal in size and configuration. Patchy foci of T2 hyperintensity in the supratentorial white matter are nonspecific but may reflect mild chronic microvascular ischemic changes. Vascular: Major vessel flow voids at the skull base are preserved. Skull and upper cervical spine: Normal marrow signal is preserved. Sinuses/Orbits: Paranasal sinuses are aerated. Orbits are unremarkable. Other: Incidental note is made of a partially empty without expansion as seen previously. Mastoid air cells are clear. MRA HEAD Intracranial internal carotid arteries are patent. Middle and anterior cerebral arteries are patent. Intracranial vertebral arteries, basilar artery, posterior cerebral arteries are patent. Bilateral posterior communicating arteries are present. There is no significant stenosis or aneurysm. MRA NECK Common, internal, and external carotid  arteries are patent. Extracranial vertebral arteries are patent and codominant. There is no measurable stenosis or evidence of dissection. IMPRESSION: No acute infarction, hemorrhage, or mass. Mild chronic microvascular ischemic changes. No hemodynamically significant stenosis in the neck. No proximal intracranial vessel occlusion or significant stenosis. Electronically Signed   By: Macy Mis M.D.   On: 06/23/2020 19:15   ECHOCARDIOGRAM COMPLETE  Result Date: 06/24/2020    ECHOCARDIOGRAM REPORT   Patient Name:   Samuel Lang Date of Exam: 06/24/2020 Medical Rec #:  [id number]     Height:       72.0 in Accession #:    [id number]    Weight:       313.9 lb Date of Birth:  26-Jul-1971     BSA:          2.581 m Patient Age:    9 years      BP:           175/91 mmHg Patient Gender: M             HR:           58 bpm. Exam Location:  Inpatient Procedure: 2D Echo and Intracardiac Opacification Agent Indications:    TIA 435.9 / G45.9  History:        Patient has no prior history of Echocardiogram examinations. TIA                 and Stroke, Arrythmias:PAC; Risk Factors:Non-Smoker,                 Dyslipidemia and Hypertension.  Sonographer:    Leavy Cella Referring Phys: [id number] [place]  1. Left ventricular ejection fraction, by estimation, is 55 to 60%. The left ventricle has normal function. The left ventricle has no regional wall motion abnormalities. There is mild left ventricular hypertrophy. Left ventricular diastolic parameters were normal.  2. Right ventricular systolic function is normal. The right ventricular size is normal.  3. Left atrial size was moderately dilated.  4. The mitral valve is normal in structure. No evidence of mitral valve regurgitation. No evidence of mitral stenosis.  5. The aortic valve is tricuspid. Aortic valve regurgitation is not visualized. Mild aortic valve sclerosis is present, with no evidence of aortic valve stenosis.  6. The inferior vena cava is normal  in size with greater than 50% respiratory variability, suggesting right atrial pressure of 3 mmHg. FINDINGS  Left Ventricle: Left ventricular ejection fraction, by estimation, is 55 to 60%. The left ventricle has normal function. The left ventricle has no regional wall motion abnormalities. Definity contrast agent was given IV to delineate the left ventricular  endocardial borders. The left ventricular internal cavity size was normal in size. There is mild left ventricular hypertrophy. Left ventricular diastolic parameters were  normal. Right Ventricle: The right ventricular size is normal. No increase in right ventricular wall thickness. Right ventricular systolic function is normal. Left Atrium: Left atrial size was moderately dilated. Right Atrium: Right atrial size was normal in size. Pericardium: There is no evidence of pericardial effusion. Mitral Valve: The mitral valve is normal in structure. There is mild thickening of the mitral valve leaflet(s). There is mild calcification of the mitral valve leaflet(s). Mild mitral annular calcification. No evidence of mitral valve regurgitation. No evidence of mitral valve stenosis. Tricuspid Valve: The tricuspid valve is normal in structure. Tricuspid valve regurgitation is not demonstrated. No evidence of tricuspid stenosis. Aortic Valve: The aortic valve is tricuspid. Aortic valve regurgitation is not visualized. Mild aortic valve sclerosis is present, with no evidence of aortic valve stenosis. Pulmonic Valve: The pulmonic valve was normal in structure. Pulmonic valve regurgitation is not visualized. No evidence of pulmonic stenosis. Aorta: The aortic root is normal in size and structure. Venous: The inferior vena cava is normal in size with greater than 50% respiratory variability, suggesting right atrial pressure of 3 mmHg. IAS/Shunts: No atrial level shunt detected by color flow Doppler.  LEFT VENTRICLE PLAX 2D LVIDd:         5.30 cm  Diastology LVIDs:         3.40  cm  LV e' medial:    7.72 cm/s LV PW:         1.60 cm  LV E/e' medial:  9.2 LV IVS:        1.20 cm  LV e' lateral:   8.70 cm/s LVOT diam:     2.10 cm  LV E/e' lateral: 8.1 LVOT Area:     3.46 cm  RIGHT VENTRICLE RV S prime:     10.90 cm/s TAPSE (M-mode): 1.5 cm LEFT ATRIUM             Index       RIGHT ATRIUM           Index LA diam:        4.60 cm 1.78 cm/m  RA Area:     16.40 cm LA Vol (A2C):   61.6 ml 23.87 ml/m RA Volume:   44.00 ml  17.05 ml/m LA Vol (A4C):   98.7 ml 38.25 ml/m LA Biplane Vol: 78.2 ml 30.30 ml/m   AORTA Ao Root diam: 3.50 cm MITRAL VALVE MV Area (PHT): 2.62 cm    SHUNTS MV Decel Time: 289 msec    Systemic Diam: 2.10 cm MV E velocity: 70.70 cm/s MV A velocity: 57.40 cm/s MV E/A ratio:  1.23 Jenkins Rouge MD Electronically signed by Jenkins Rouge MD Signature Date/Time: 06/24/2020/1:59:00 PM    Final      Labs:   Basic Metabolic Panel: Recent Labs  Lab 06/23/20 1439 06/23/20 1439 06/23/20 1447 06/24/20 0237  NA 136  --  138 138  K 3.5   < > 3.4* 3.1*  CL 100  --  98 100  CO2 25  --   --  26  GLUCOSE 219*  --  209* 140*  BUN 6  --  7 7  CREATININE 0.81  --  0.60* 0.76  CALCIUM 9.8  --   --  9.7   < > = values in this interval not displayed.   GFR Estimated Creatinine Clearance: 161.7 mL/min (by C-G formula based on SCr of 0.76 mg/dL). Liver Function Tests: Recent Labs  Lab 06/23/20 1439  AST 61*  ALT 62*  ALKPHOS 83  BILITOT 1.3*  PROT 8.1  ALBUMIN 4.2   No results for input(s): LIPASE, AMYLASE in the last 168 hours. No results for input(s): AMMONIA in the last 168 hours. Coagulation profile Recent Labs  Lab 06/23/20 1439  INR 1.0    CBC: Recent Labs  Lab 06/23/20 1439 06/23/20 1447 06/24/20 0237  WBC 9.3  --  9.7  NEUTROABS 6.1  --  5.9  HGB 18.2* 19.0* 17.7*  HCT 54.2* 56.0* 50.8  MCV 91.6  --  90.7  PLT 151  --  153   Cardiac Enzymes: No results for input(s): CKTOTAL, CKMB, CKMBINDEX, TROPONINI in the last 168  hours. BNP: Invalid input(s): POCBNP CBG: Recent Labs  Lab 06/23/20 2218 06/24/20 0631 06/24/20 1101 06/24/20 1504  GLUCAP 130* 126* 154* 140*   D-Dimer No results for input(s): DDIMER in the last 72 hours. Hgb A1c Recent Labs    06/24/20 0237  HGBA1C 5.6   Lipid Profile Recent Labs    06/24/20 0237  CHOL 192  HDL 49  LDLCALC 98  TRIG 223*  CHOLHDL 3.9   Thyroid function studies No results for input(s): TSH, T4TOTAL, T3FREE, THYROIDAB in the last 72 hours.  Invalid input(s): FREET3 Anemia work up [employer]    06/23/20 2146  FERRITIN 134  TIBC 448  IRON 101  RETICCTPCT 1.9   Microbiology Recent Results (from the past 240 hour(s))  Respiratory Panel by RT PCR (Flu A&B, Covid) - Nasopharyngeal Swab     Status: None   Collection Time: 06/23/20  5:44 PM   Specimen: Nasopharyngeal Swab; Nasopharyngeal(NP) swabs in vial transport medium  Result Value Ref Range Status   SARS Coronavirus 2 by RT PCR NEGATIVE NEGATIVE Final    Comment: (NOTE) SARS-CoV-2 target nucleic acids are NOT DETECTED.  The SARS-CoV-2 RNA is generally detectable in upper respiratoy specimens during the acute phase of infection. The lowest concentration of SARS-CoV-2 viral copies this assay can detect is 131 copies/mL. A negative result does not preclude SARS-Cov-2 infection and should not be used as the sole basis for treatment or other patient management decisions. A negative result may occur with  improper specimen collection/handling, submission of specimen other than nasopharyngeal swab, presence of viral mutation(s) within the areas targeted by this assay, and inadequate number of viral copies (<131 copies/mL). A negative result must be combined with clinical observations, patient history, and epidemiological information. The expected result is Negative.  Fact Sheet for Patients:  PinkCheek.be  Fact Sheet for Healthcare Providers:   GravelBags.it  This test is no t yet approved or cleared by the [place] FDA and  has been authorized for detection and/or diagnosis of SARS-CoV-2 by FDA under an Emergency Use Authorization (EUA). This EUA will remain  in effect (meaning this test can be used) for the duration of the COVID-19 declaration under Section 564(b)(1) of the Act, 21 U.S.C. section 360bbb-3(b)(1), unless the authorization is terminated or revoked sooner.     Influenza A by PCR NEGATIVE NEGATIVE Final   Influenza B by PCR NEGATIVE NEGATIVE Final    Comment: (NOTE) The Xpert Xpress SARS-CoV-2/FLU/RSV assay is intended as an aid in  the diagnosis of influenza from Nasopharyngeal swab specimens and  should not be used as a sole basis for treatment. Nasal washings and  aspirates are unacceptable for Xpert Xpress SARS-CoV-2/FLU/RSV  testing.  Fact Sheet for Patients: PinkCheek.be  Fact Sheet for Healthcare Providers: GravelBags.it  This test is not yet approved or cleared by  the [employer] and  has been authorized for detection and/or diagnosis of SARS-CoV-2 by  FDA under an Emergency Use Authorization (EUA). This EUA will remain  in effect (meaning this test can be used) for the duration of the  Covid-19 declaration under Section 564(b)(1) of the Act, 21  U.S.C. section 360bbb-3(b)(1), unless the authorization is  terminated or revoked. Performed at [facility], [place] [address] [place], [place] [address]      Signed: Terrilee Croak  [facility] 06/25/2020, 7:32 AM

## 2020-06-26 LAB — ERYTHROPOIETIN: Erythropoietin: 9 m[IU]/mL (ref 2.6–18.5)

## 2020-07-07 ENCOUNTER — Ambulatory Visit: Payer: BC Managed Care – PPO | Admitting: Internal Medicine

## 2020-08-10 ENCOUNTER — Encounter: Payer: Self-pay | Admitting: Internal Medicine

## 2020-08-10 ENCOUNTER — Ambulatory Visit (INDEPENDENT_AMBULATORY_CARE_PROVIDER_SITE_OTHER): Payer: BC Managed Care – PPO | Admitting: Internal Medicine

## 2020-08-10 VITALS — BP 150/90 | HR 72 | Ht 71.75 in | Wt 309.5 lb

## 2020-08-10 DIAGNOSIS — Z86010 Personal history of colonic polyps: Secondary | ICD-10-CM

## 2020-08-10 DIAGNOSIS — K760 Fatty (change of) liver, not elsewhere classified: Secondary | ICD-10-CM | POA: Diagnosis not present

## 2020-08-10 DIAGNOSIS — K219 Gastro-esophageal reflux disease without esophagitis: Secondary | ICD-10-CM

## 2020-08-10 DIAGNOSIS — R059 Cough, unspecified: Secondary | ICD-10-CM | POA: Diagnosis not present

## 2020-08-10 NOTE — Patient Instructions (Signed)
If you are age 51 or older, your body mass index should be between 23-30. Your Body mass index is 42.27 kg/m. If this is out of the aforementioned range listed, please consider follow up with your Primary Care Provider.  If you are age 32 or younger, your body mass index should be between 19-25. Your Body mass index is 42.27 kg/m. If this is out of the aformentioned range listed, please consider follow up with your Primary Care Provider.   If you are age 29 or older, your body mass index should be between 23-30. Your Body mass index is 42.27 kg/m. If this is out of the aforementioned range listed, please consider follow up with your Primary Care Provider.  If you are age 46 or younger, your body mass index should be between 19-25. Your Body mass index is 42.27 kg/m. If this is out of the aformentioned range listed, please consider follow up with your Primary Care Provider.   Stop alcohol  Use Lozenges  Please follow up with Dr. Marina Goodell in 6 months.

## 2020-08-10 NOTE — Progress Notes (Signed)
HISTORY OF PRESENT ILLNESS:  Samuel Lang is a 50 y.o. 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  Secondary issues include history of fatty liver and adenomatous colon polyps.  I saw the patient on 1 occasion in September 2017 when he underwent surveillance colonoscopy for history of adenomatous colon polyp in 2012 with Dr. Kinnie Scales.  The colonoscopy 2017 revealed external hemorrhoids, left-sided diverticulosis, and a diminutive tubular adenoma which was removed.  Follow-up in 5 years recommended patient's current history is that of intermittent problems with cough.  He was evaluated by his primary care provider who diagnosed acid reflux and prescribed sucralfate.  This did not help.  Prior to that he did report infrequent mild classic reflux symptoms.  He was subsequently seen by gastroenterologist Dr. Jeani Hawking.  No records available, but the patient tells me that he was diagnosed with GERD and placed on omeprazole 40 mg twice daily.  Despite being compliant with therapy (4 to 6 months) he has had no improvement.  The patient now seeks a 2nd opinion.  He describes to me daily cough.  He also describes a sensation of granular material in the back of his throat which he finds irritating.  He does have a history of fatty liver with elevated liver tests.  He also drinks multiple bourbon drinks per day.  He has undergone relatively recent umbilical hernia repair.  He has been diabetic for about 1 year.  He has undergone intermittent fasting and lost 30 pounds.  Most recent hemoglobin A1c 5.3.  He denies active reflux symptoms.  He has had no other evaluations regarding the chronic cough.  Blood work from November 2021 shows AST 61, ALT 62, alkaline phosphatase 83, total bilirubin 1.3.  CBC reveals hemoglobin 18.2.  Platelet count 151,000.  White blood cell count 9.3.  Prothrombin time 12.2 with INR 1.0.   Abdominal ultrasound from October 2019 revealed changes consistent with fatty liver.  Patient has completed his COVID vaccination series and booster  REVIEW OF SYSTEMS:  All non-GI ROS negative unless otherwise stated in the HPI except for sinus allergy, anxiety, back pain, cough, depression, fatigue, sore throat, shortness of breath  Past Medical History:  Diagnosis Date  . Allergic rhinitis   . Arthritis   . Asthma   . Colon polyps   . Depression   . Diabetes mellitus without complication (HCC)   . Diverticulitis   . Enlarged prostate   . Fatty liver   . Hernia, umbilical   . Hyperlipidemia   . Hypertension   . Obesity   . Sciatic nerve pain   . Sleep apnea    wears CPAP at night  . UTI (lower urinary tract infection)     Past Surgical History:  Procedure Laterality Date  . INSERTION OF MESH N/A 06/01/2019   Procedure: Insertion Of Mesh;  Surgeon: Manus Rudd, MD;  Location[facility] OR;  Service: General;  Laterality: N/A;  . traumatic pneumothorax sledding accident,rib rx, left pneumothorax, chest tube    . UMBILICAL HERNIA REPAIR N/A 06/01/2019   Procedure: HERNIA REPAIR UMBILICAL WITH MESH;  Surgeon: Manus Rudd, MD;  Location: [facility] OR;  Service: General;  Laterality: N/A;    Social History Donnie Coffin  reports that he has never smoked. He has never used smokeless tobacco. He reports current alcohol use of about 10.0 standard drinks of alcohol per week. He reports current drug  use. Drug: Marijuana.  family history includes Allergies in his brother; Diabetes in his father; Lung cancer in his mother; Stroke in his father.  Allergies  Allergen Reactions  . Amlodipine Swelling and Other (See Comments)    Made the feet swell       PHYSICAL EXAMINATION: Vital signs: BP (!) 150/90 (BP Location: Left Arm, Patient Position: Sitting, Cuff Size: Normal)   Pulse 72   Ht 5' 11.75" (1.822 m) Comment: height measured without shoes  Wt (!) 309 lb 8 oz (140.4 kg)   BMI  42.27 kg/m   Constitutional: Pleasant, obese, but generally well-appearing, no acute distress Psychiatric: alert and oriented x3, cooperative Eyes: extraocular movements intact, anicteric, conjunctiva pink Mouth: oral pharynx moist, no lesions.  No evidence of thrush.  No obvious posterior pharyngeal abnormalities of the areas that to be examined. Neck: supple no lymphadenopathy Cardiovascular: heart regular rate and rhythm, no murmur Lungs: clear to auscultation bilaterally Abdomen: soft, obese, nontender, nondistended, no obvious ascites, no peritoneal signs, normal bowel sounds, no organomegaly.  Midline diastases present.  Umbilical incision scar well-healing. Rectal: Omitted Extremities: no clubbing, cyanosis, or lower extremity edema bilaterally Skin: no lesions on visible extremities Neuro: No focal deficits. No asterixis.   ASSESSMENT:  1.  Chronic cough and pharyngeal irritation as described.  No response to long-term high-dose PPI.  This begs the question of something other than GERD to explain the symptoms (which would be atypical for GERD). 2.  GERD.  Mild symptoms by history 3.  Elevated hepatic transaminases.  Fatty liver secondary to obesity and/or alcohol. 4.  Obesity with BMI 42.3 5.  History of adenomatous colon polyps.  Surveillance up-to-date.  Last examination of September 2017   PLAN:  1.  Schedule upper endoscopy to assess for any significant reflux related abnormalities. 2.  Exercise and weight loss 3.  Limit or eliminate alcohol 4.  If upper endoscopy unrevealing, then would recommend formal ENT and/or allergy evaluation. 5.  Routine surveillance colonoscopy due around September 2022.  He is aware 6.  Routine office follow-up regarding management of his liver test abnormalities (fatty liver) in 6 months.  He is aware A total time of 60 minutes was required preparing to see the patient, reviewing outside laboratories and x-rays.  Obtaining comprehensive  history.  Performing medically appropriate and comprehensive physical examination.  Counseling the patient regarding his above listed issues.  Ordering advanced procedures (endoscopic) and clinical follow-up.  And documenting clinical information in the health record

## 2020-08-24 ENCOUNTER — Ambulatory Visit (AMBULATORY_SURGERY_CENTER): Payer: BC Managed Care – PPO | Admitting: Internal Medicine

## 2020-08-24 ENCOUNTER — Encounter: Payer: Self-pay | Admitting: Internal Medicine

## 2020-08-24 ENCOUNTER — Other Ambulatory Visit: Payer: Self-pay

## 2020-08-24 VITALS — BP 140/70 | HR 53 | Temp 97.6°F | Resp 13 | Ht 71.0 in | Wt 309.0 lb

## 2020-08-24 DIAGNOSIS — K219 Gastro-esophageal reflux disease without esophagitis: Secondary | ICD-10-CM | POA: Diagnosis present

## 2020-08-24 DIAGNOSIS — R059 Cough, unspecified: Secondary | ICD-10-CM

## 2020-08-24 MED ORDER — SODIUM CHLORIDE 0.9 % IV SOLN: 500.0000 mL | Freq: Once | INTRAVENOUS | Status: DC

## 2020-08-24 NOTE — Op Note (Signed)
[facility] [facility] Patient Name: Samuel Lang Procedure Date: 08/24/2020 3:40 PM MRN: [id number] Endoscopist: Docia Chuck. Henrene Pastor , MD Age: 50 Referring MD:  Date of Birth: 1971-06-01 Gender: Male Account #: [id number] Procedure:                Upper GI endoscopy Indications:              Chronic cough, pharyngeal irritation, mild reflux                            symptoms. Pharyngeal complaints received no                            response to long-term high-dose PPI. Medicines:                Monitored Anesthesia Care Procedure:                Pre-Anesthesia Assessment:                           - Prior to the procedure, a History and Physical                            was performed, and patient medications and                            allergies were reviewed. The patient's tolerance of                            previous anesthesia was also reviewed. The risks                            and benefits of the procedure and the sedation                            options and risks were discussed with the patient.                            All questions were answered, and informed consent                            was obtained. Prior Anticoagulants: The patient has                            taken no previous anticoagulant or antiplatelet                            agents. ASA Grade Assessment: II - A patient with                            mild systemic disease. After reviewing the risks                            and benefits, the patient was deemed in  satisfactory condition to undergo the procedure.                           After obtaining informed consent, the endoscope was                            passed under direct vision. Throughout the                            procedure, the patient's blood pressure, pulse, and                            oxygen saturations were monitored continuously. The                            Endoscope was introduced  through the mouth, and                            advanced to the second part of duodenum. The upper                            GI endoscopy was accomplished without difficulty.                            The patient tolerated the procedure well. Scope In: Scope Out: Findings:                 The esophagus was normal.                           The stomach was normal.                           The examined duodenum was normal.                           The cardia and gastric fundus were normal on                            retroflexion. Complications:            No immediate complications. Estimated Blood Loss:     Estimated blood loss: none. Impression:               1. Normal EGD                           2. Mild GERD by history                           3. No GI cause for pharyngeal complaints found or                            suspected. Recommendation:           1. Patient has a contact number available for  emergencies. The signs and symptoms of potential                            delayed complications were discussed with the                            patient. Return to normal activities tomorrow.                            Written discharge instructions were provided to the                            patient.                           2. Reflux precautions with attention to weight loss.                           3. Continue present medications.                           4. Consider ENT evaluation for chronic pharyngeal                            complaints. This can be arranged through your                            primary care provider                           5. GI follow-up as needed Docia Chuck. Henrene Pastor, MD 08/24/2020 4:07:22 PM This report has been signed electronically.

## 2020-08-24 NOTE — Progress Notes (Signed)
VS- Samuel Lang  Freestyle glucose monitor left upper tricep  Per pt, "I have flare up- with a low grade fever, cannot used the bathroom and left lower abdominal pain."  Pt states "I have had this over the past few days."  No pain at this.  Advised to mention to Dr. Henrene Pastor  Pt has a fever blister on lower, left side of lip- noted pre-procedure

## 2020-08-24 NOTE — Progress Notes (Signed)
A and O x3. Report to RN. Tolerated MAC anesthesia well.Teeth unchanged after procedure.

## 2020-08-24 NOTE — Patient Instructions (Signed)
YOU HAD AN ENDOSCOPIC PROCEDURE TODAY AT THE [facility]:   Refer to the procedure report that was given to you for any specific questions about what was found during the examination.  If the procedure report does not answer your questions, please call your gastroenterologist to clarify.  If you requested that your care partner not be given the details of your procedure findings, then the procedure report has been included in a sealed envelope for you to review at your convenience later.  YOU SHOULD EXPECT: Some feelings of bloating in the abdomen. Passage of more gas than usual.  Walking can help get rid of the air that was put into your GI tract during the procedure and reduce the bloating. If you had a lower endoscopy (such as a colonoscopy or flexible sigmoidoscopy) you may notice spotting of blood in your stool or on the toilet paper. If you underwent a bowel prep for your procedure, you may not have a normal bowel movement for a few days.  Please Note:  You might notice some irritation and congestion in your nose or some drainage.  This is from the oxygen used during your procedure.  There is no need for concern and it should clear up in a day or so.  SYMPTOMS TO REPORT IMMEDIATELY:    Following upper endoscopy (EGD)  Vomiting of blood or coffee ground material  New chest pain or pain under the shoulder blades  Painful or persistently difficult swallowing  New shortness of breath  Fever of 100F or higher  Black, tarry-looking stools  For urgent or emergent issues, a gastroenterologist can be reached at any hour by calling [phone]. Do not use MyChart messaging for urgent concerns.    DIET:  We do recommend a small meal at first, but then you may proceed to your regular diet.  Drink plenty of fluids but you should avoid alcoholic beverages for 24 hours.  ACTIVITY:  You should plan to take it easy for the rest of today and you should NOT DRIVE or use heavy machinery  until tomorrow (because of the sedation medicines used during the test).    FOLLOW UP: Our staff will call the number listed on your records 48-72 hours following your procedure to check on you and address any questions or concerns that you may have regarding the information given to you following your procedure. If we do not reach you, we will leave a message.  We will attempt to reach you two times.  During this call, we will ask if you have developed any symptoms of COVID 19. If you develop any symptoms (ie: fever, flu-like symptoms, shortness of breath, cough etc.) before then, please call [phone].  If you test positive for Covid 19 in the 2 weeks post procedure, please call and report this information to us.    If any biopsies were taken you will be contacted by phone or by letter within the next 1-3 weeks.  Please call us at [phone] if you have not heard about the biopsies in 3 weeks.    SIGNATURES/CONFIDENTIALITY: You and/or your care partner have signed paperwork which will be entered into your electronic medical record.  These signatures attest to the fact that that the information above on your After Visit Summary has been reviewed and is understood.  Full responsibility of the confidentiality of this discharge information lies with you and/or your care-partner. 

## 2020-08-28 ENCOUNTER — Telehealth: Payer: Self-pay

## 2020-08-28 ENCOUNTER — Telehealth: Payer: Self-pay | Admitting: *Deleted

## 2020-08-28 NOTE — Telephone Encounter (Signed)
  Follow up Call-  Call back number 08/24/2020  Post procedure Call Back phone  # [phone]  Permission to leave phone message Yes  Some recent data might be hidden     No answer at 2nd attempt follow up phone call.  Left message on voicemail.

## 2020-08-28 NOTE — Telephone Encounter (Signed)
First post procedure follow up visit, no answer 

## 2021-02-16 ENCOUNTER — Encounter: Payer: Self-pay | Admitting: Internal Medicine

## 2021-04-10 ENCOUNTER — Encounter: Payer: Self-pay | Admitting: Internal Medicine

## 2021-05-23 ENCOUNTER — Telehealth: Payer: Self-pay | Admitting: *Deleted

## 2021-05-23 NOTE — Telephone Encounter (Signed)
Patient no show/answer PV appointment for today. I called patient x2, no answer, left a message for the patient to call us back today before 5 pm or the PV and procedure will be cancelled. ? ?

## 2021-05-23 NOTE — Telephone Encounter (Signed)
Called patient again, no answer. Left a message.

## 2021-05-23 NOTE — Telephone Encounter (Signed)
Patient did not call us back. Colon and PV cancelled and no show letter mailed to pt and sent to mychart.

## 2021-06-07 ENCOUNTER — Encounter: Payer: BC Managed Care – PPO | Admitting: Internal Medicine

## 2022-01-29 ENCOUNTER — Encounter: Payer: Self-pay | Admitting: Internal Medicine

## 2022-02-06 ENCOUNTER — Encounter: Payer: Self-pay | Admitting: Internal Medicine

## 2022-02-19 ENCOUNTER — Ambulatory Visit (AMBULATORY_SURGERY_CENTER): Payer: Self-pay | Admitting: *Deleted

## 2022-02-19 VITALS — Ht 72.0 in | Wt 329.0 lb

## 2022-02-19 DIAGNOSIS — Z86010 Personal history of colonic polyps: Secondary | ICD-10-CM

## 2022-02-19 MED ORDER — NA SULFATE-K SULFATE-MG SULF 17.5-3.13-1.6 GM/177ML PO SOLN: 1.0000 | Freq: Once | ORAL | 0 refills | Status: AC

## 2022-02-19 NOTE — Progress Notes (Signed)
No egg or soy allergy known to patient  No issues known to pt with past sedation with any surgeries or procedures Patient denies ever being told they had issues or difficulty with intubation  No FH of Malignant Hyperthermia Pt is not on diet pills Pt is not on home 02  Pt is not on blood thinners  Pt denies issues with constipation  No A fib or A flutter Have any cardiac testing pending--NO Pt instructed to use Singlecare.com or GoodRx for a price reduction on prep   

## 2022-03-07 ENCOUNTER — Encounter: Payer: Self-pay | Admitting: Internal Medicine

## 2022-03-07 DIAGNOSIS — Z86010 Personal history of colonic polyps: Secondary | ICD-10-CM

## 2022-03-07 MED ORDER — NA SULFATE-K SULFATE-MG SULF 17.5-3.13-1.6 GM/177ML PO SOLN: 1.0000 | ORAL | 0 refills | Status: DC

## 2022-03-08 ENCOUNTER — Encounter: Payer: Self-pay | Admitting: Internal Medicine

## 2022-03-13 ENCOUNTER — Ambulatory Visit (AMBULATORY_SURGERY_CENTER): Payer: BC Managed Care – PPO | Admitting: Internal Medicine

## 2022-03-13 ENCOUNTER — Encounter: Payer: Self-pay | Admitting: Internal Medicine

## 2022-03-13 VITALS — BP 127/62 | HR 58 | Temp 99.1°F | Resp 13 | Ht 71.75 in | Wt 329.0 lb

## 2022-03-13 DIAGNOSIS — Z86010 Personal history of colonic polyps: Secondary | ICD-10-CM

## 2022-03-13 DIAGNOSIS — Z09 Encounter for follow-up examination after completed treatment for conditions other than malignant neoplasm: Secondary | ICD-10-CM | POA: Diagnosis present

## 2022-03-13 MED ORDER — SODIUM CHLORIDE 0.9 % IV SOLN: 500.0000 mL | Freq: Once | INTRAVENOUS | Status: DC

## 2022-03-13 NOTE — Patient Instructions (Signed)
Handouts given on hemorrhoids and diverticulosis.  YOU HAD AN ENDOSCOPIC PROCEDURE TODAY AT [facility]:   Refer to the procedure report that was given to you for any specific questions about what was found during the examination.  If the procedure report does not answer your questions, please call your gastroenterologist to clarify.  If you requested that your care partner not be given the details of your procedure findings, then the procedure report has been included in a sealed envelope for you to review at your convenience later.  YOU SHOULD EXPECT: Some feelings of bloating in the abdomen. Passage of more gas than usual.  Walking can help get rid of the air that was put into your GI tract during the procedure and reduce the bloating. If you had a lower endoscopy (such as a colonoscopy or flexible sigmoidoscopy) you may notice spotting of blood in your stool or on the toilet paper. If you underwent a bowel prep for your procedure, you may not have a normal bowel movement for a few days.  Please Note:  You might notice some irritation and congestion in your nose or some drainage.  This is from the oxygen used during your procedure.  There is no need for concern and it should clear up in a day or so.  SYMPTOMS TO REPORT IMMEDIATELY:  Following lower endoscopy (colonoscopy or flexible sigmoidoscopy):  Excessive amounts of blood in the stool  Significant tenderness or worsening of abdominal pains  Swelling of the abdomen that is new, acute  Fever of 100F or higher  For urgent or emergent issues, a gastroenterologist can be reached at any hour by calling [phone]. Do not use MyChart messaging for urgent concerns.    DIET:  We do recommend a small meal at first, but then you may proceed to your regular diet.  Drink plenty of fluids but you should avoid alcoholic beverages for 24 hours.  ACTIVITY:  You should plan to take it easy for the rest of today and you should NOT  DRIVE or use heavy machinery until tomorrow (because of the sedation medicines used during the test).    FOLLOW UP: Our staff will call the number listed on your records the next business day following your procedure.  We will call around 7:15- 8:00 am to check on you and address any questions or concerns that you may have regarding the information given to you following your procedure. If we do not reach you, we will leave a message.  If you develop any symptoms (ie: fever, flu-like symptoms, shortness of breath, cough etc.) before then, please call [phone].  If you test positive for Covid 19 in the 2 weeks post procedure, please call and report this information to [place].    If any biopsies were taken you will be contacted by phone or by letter within the next 1-3 weeks.  Please call us at [phone] if you have not heard about the biopsies in 3 weeks.    SIGNATURES/CONFIDENTIALITY: You and/or your care partner have signed paperwork which will be entered into your electronic medical record.  These signatures attest to the fact that that the information above on your After Visit Summary has been reviewed and is understood.  Full responsibility of the confidentiality of this discharge information lies with you and/or your care-partner.

## 2022-03-13 NOTE — Op Note (Signed)
[place] Patient Name: Samuel Lang Procedure Date: 03/13/2022 3:43 PM MRN: [id number] Endoscopist: Docia Chuck. Henrene Pastor , MD Age: 51 Referring MD:  Date of Birth: [birth year] Gender: Male Account #: [id number] Procedure:                Colonoscopy Indications:              High risk colon cancer surveillance: Personal                            history of non-advanced adenomas. Previous                            examinations 2012 (Dr. Earlean Shawl); 2017 Medicines:                Monitored Anesthesia Care Procedure:                Pre-Anesthesia Assessment:                           - Prior to the procedure, a History and Physical                            was performed, and patient medications and                            allergies were reviewed. The patient's tolerance of                            previous anesthesia was also reviewed. The risks                            and benefits of the procedure and the sedation                            options and risks were discussed with the patient.                            All questions were answered, and informed consent                            was obtained. Prior Anticoagulants: The patient has                            taken no previous anticoagulant or antiplatelet                            agents. ASA Grade Assessment: II - A patient with                            mild systemic disease. After reviewing the risks                            and benefits, the patient was deemed in  satisfactory condition to undergo the procedure.                           After obtaining informed consent, the colonoscope                            was passed under direct vision. Throughout the                            procedure, the patient's blood pressure, pulse, and                            oxygen saturations were monitored continuously. The                            Olympus CF-HQ190L ([id number])  Colonoscope was                            introduced through the anus and advanced to the the                            cecum, identified by appendiceal orifice and                            ileocecal valve. The ileocecal valve, appendiceal                            orifice, and rectum were photographed. The quality                            of the bowel preparation was good. The colonoscopy                            was performed without difficulty. The patient                            tolerated the procedure well. The bowel preparation                            used was SUPREP via split dose instruction. Scope In: 3:56:22 PM Scope Out: 4:08:47 PM Scope Withdrawal Time: 0 hours 10 minutes 42 seconds  Total Procedure Duration: 0 hours 12 minutes 25 seconds  Findings:                 Multiple diverticula were found in the transverse                            colon and left colon.                           Internal hemorrhoids were found during retroflexion.                           The exam was otherwise without abnormality on  direct and retroflexion views. Complications:            No immediate complications. Estimated blood loss:                            None. Estimated Blood Loss:     Estimated blood loss: none. Impression:               - Diverticulosis in the transverse colon and in the                            left colon.                           - Internal hemorrhoids.                           - The examination was otherwise normal on direct                            and retroflexion views.                           - No specimens collected. Recommendation:           - Repeat colonoscopy in 10 years for surveillance.                           - Patient has a contact number available for                            emergencies. The signs and symptoms of potential                            delayed complications were discussed with the                             patient. Return to normal activities tomorrow.                            Written discharge instructions were provided to the                            patient.                           - Resume previous diet.                           - Continue present medications. Docia Chuck. Henrene Pastor, MD 03/13/2022 4:14:59 PM This report has been signed electronically.

## 2022-03-13 NOTE — Progress Notes (Signed)
Sedate, gd SR, tolerated procedure well, VSS, report to RN 

## 2022-03-13 NOTE — Progress Notes (Addendum)
Pt's states no medical or surgical changes since previsit or office visit. Patient started an abx on Aug 1 for UTI or bladder infection but doesn't remember the name of it.

## 2022-03-14 ENCOUNTER — Telehealth: Payer: Self-pay

## 2022-03-14 NOTE — Telephone Encounter (Signed)
  Follow up Call-     03/13/2022    3:28 PM 08/24/2020    3:18 PM  Call back number  Post procedure Call Back phone  # [phone]  Permission to leave phone message Yes Yes     Left message

## 2022-11-21 ENCOUNTER — Other Ambulatory Visit (HOSPITAL_COMMUNITY): Payer: Self-pay

## 2022-11-21 MED ORDER — OZEMPIC (2 MG/DOSE) 8 MG/3ML ~~LOC~~ SOPN
2.0000 mg | PEN_INJECTOR | SUBCUTANEOUS | 0 refills | Status: DC
Start: 2022-11-21 — End: 2023-03-06
  Filled 2022-11-21 – 2022-11-29 (×2): qty 9, 84d supply, fill #0

## 2022-11-28 ENCOUNTER — Other Ambulatory Visit (HOSPITAL_COMMUNITY): Payer: Self-pay

## 2022-11-29 ENCOUNTER — Other Ambulatory Visit (HOSPITAL_COMMUNITY): Payer: Self-pay

## 2023-03-06 ENCOUNTER — Other Ambulatory Visit (HOSPITAL_COMMUNITY): Payer: Self-pay

## 2023-03-06 MED ORDER — SEMAGLUTIDE (2 MG/DOSE) 8 MG/3ML ~~LOC~~ SOPN
2.0000 mg | PEN_INJECTOR | SUBCUTANEOUS | 0 refills | Status: AC
  Filled 2023-03-06: qty 9, 84d supply, fill #0
